# Patient Record
Sex: Female | Born: 1965 | Race: White | Hispanic: No | Marital: Married | Smoking: Former smoker
Health system: Southern US, Community
[De-identification: ages and names within clinical notes are randomized; demographics above are authoritative.]

## PROBLEM LIST (undated history)

## (undated) DIAGNOSIS — E079 Disorder of thyroid, unspecified: Secondary | ICD-10-CM

## (undated) DIAGNOSIS — Q638 Other specified congenital malformations of kidney: Secondary | ICD-10-CM

## (undated) DIAGNOSIS — N281 Cyst of kidney, acquired: Secondary | ICD-10-CM

## (undated) DIAGNOSIS — G932 Benign intracranial hypertension: Secondary | ICD-10-CM

## (undated) DIAGNOSIS — E039 Hypothyroidism, unspecified: Secondary | ICD-10-CM

## (undated) DIAGNOSIS — F32A Depression, unspecified: Secondary | ICD-10-CM

## (undated) DIAGNOSIS — F419 Anxiety disorder, unspecified: Secondary | ICD-10-CM

## (undated) DIAGNOSIS — E559 Vitamin D deficiency, unspecified: Secondary | ICD-10-CM

## (undated) DIAGNOSIS — F329 Major depressive disorder, single episode, unspecified: Secondary | ICD-10-CM

## (undated) DIAGNOSIS — E7800 Pure hypercholesterolemia, unspecified: Secondary | ICD-10-CM

## (undated) DIAGNOSIS — G4733 Obstructive sleep apnea (adult) (pediatric): Secondary | ICD-10-CM

## (undated) DIAGNOSIS — R000 Tachycardia, unspecified: Secondary | ICD-10-CM

## (undated) DIAGNOSIS — E785 Hyperlipidemia, unspecified: Secondary | ICD-10-CM

## (undated) DIAGNOSIS — G4730 Sleep apnea, unspecified: Secondary | ICD-10-CM

## (undated) DIAGNOSIS — K219 Gastro-esophageal reflux disease without esophagitis: Secondary | ICD-10-CM

## (undated) DIAGNOSIS — E1169 Type 2 diabetes mellitus with other specified complication: Secondary | ICD-10-CM

## (undated) HISTORY — DX: Obstructive sleep apnea (adult) (pediatric): G47.33

## (undated) HISTORY — PX: REPLACEMENT TOTAL KNEE: SUR1224

## (undated) HISTORY — PX: CERVICAL CONE BIOPSY: SUR198

## (undated) HISTORY — DX: Hypothyroidism, unspecified: E03.9

## (undated) HISTORY — DX: Benign intracranial hypertension: G93.2

## (undated) HISTORY — PX: CHOLECYSTECTOMY: SHX55

## (undated) HISTORY — DX: Pure hypercholesterolemia, unspecified: E78.00

## (undated) HISTORY — DX: Gastro-esophageal reflux disease without esophagitis: K21.9

## (undated) HISTORY — DX: Vitamin D deficiency, unspecified: E55.9

## (undated) HISTORY — DX: Other specified congenital malformations of kidney: Q63.8

## (undated) HISTORY — DX: Type 2 diabetes mellitus with other specified complication: E11.69

## (undated) HISTORY — DX: Depression, unspecified: F32.A

## (undated) HISTORY — DX: Morbid (severe) obesity due to excess calories: E66.01

## (undated) HISTORY — PX: CARPAL TUNNEL RELEASE: SHX101

## (undated) HISTORY — DX: Sleep apnea, unspecified: G47.30

## (undated) HISTORY — DX: Disorder of thyroid, unspecified: E07.9

## (undated) HISTORY — DX: Anxiety disorder, unspecified: F41.9

## (undated) HISTORY — DX: Major depressive disorder, single episode, unspecified: F32.9

## (undated) HISTORY — DX: Cyst of kidney, acquired: N28.1

## (undated) HISTORY — DX: Tachycardia, unspecified: R00.0

## (undated) HISTORY — DX: Hyperlipidemia, unspecified: E78.5

---

## 2015-11-07 DIAGNOSIS — M7661 Achilles tendinitis, right leg: Secondary | ICD-10-CM | POA: Insufficient documentation

## 2015-11-07 DIAGNOSIS — M6701 Short Achilles tendon (acquired), right ankle: Secondary | ICD-10-CM

## 2015-11-07 HISTORY — DX: Short Achilles tendon (acquired), right ankle: M67.01

## 2015-11-07 HISTORY — DX: Achilles tendinitis, right leg: M76.61

## 2018-01-20 ENCOUNTER — Other Ambulatory Visit: Payer: Self-pay | Admitting: Nurse Practitioner

## 2018-01-20 DIAGNOSIS — G932 Benign intracranial hypertension: Secondary | ICD-10-CM

## 2018-01-26 ENCOUNTER — Other Ambulatory Visit: Payer: Self-pay

## 2018-01-27 ENCOUNTER — Ambulatory Visit
Admission: RE | Admit: 2018-01-27 | Discharge: 2018-01-27 | Disposition: A | Discharge status: Home / Self Care | Payer: 59 | Source: Ambulatory Visit | Attending: Nurse Practitioner | Admitting: Nurse Practitioner

## 2018-01-27 DIAGNOSIS — G932 Benign intracranial hypertension: Secondary | ICD-10-CM

## 2018-05-05 ENCOUNTER — Other Ambulatory Visit: Payer: Self-pay

## 2018-05-05 ENCOUNTER — Encounter: Payer: Self-pay | Admitting: Neurology

## 2018-05-05 ENCOUNTER — Ambulatory Visit: Payer: 59 | Admitting: Neurology

## 2018-05-05 VITALS — BP 119/82 | HR 92 | Ht 63.0 in | Wt 302.0 lb

## 2018-05-05 DIAGNOSIS — G932 Benign intracranial hypertension: Secondary | ICD-10-CM

## 2018-05-05 HISTORY — DX: Morbid (severe) obesity due to excess calories: E66.01

## 2018-05-05 NOTE — Progress Notes (Signed)
Reason for visit: Pseudotumor cerebri  Referring physician: Dr. Jason CoopFlaherty  Natasha Sanchez is a 52 y.o. female  History of present illness:  Natasha Sanchez is a 52 year old right-handed white female with a history of morbid obesity.  The patient has a history of pseudotumor cerebri, she was originally diagnosed in 1999 and treated by Dr. Adella HareApplegate.  The patient was on Diamox for a time, she went off the medication and has had no symptoms until about 3 or 4 months ago.  The patient claims that she has gained about 20 pounds in the last 6 months.  She started getting headaches in the last several months, she underwent MRI of the brain in June 2019, this study was unremarkable.  The patient indicates that her headaches are not daily in nature, they may occur 2-3 times a week, and are behind the eye and sometimes around the entire head with a pressure sensation.  She may have some neck involvement as well, she denies any muffled hearing.  The patient has had some blurring of vision but this mainly occurred after she started eyedrops for glaucoma.  The patient has bilateral elevated intraocular pressures, they are 20 bilaterally.  The patient went to her optometrist recently and was found to have some mild disc blurring bilaterally, no hemorrhages were noted.  The patient reports no numbness or weakness of the face, arms, legs.  She denies issues controlling the bowels or the bladder.  She did report a problem with gait instability 3 months ago, but this has since improved.  She has had daily headaches over the last week.  She is on no medications for the headache currently.  The patient reports occasional double vision.  Past Medical History:  Diagnosis Date  . Anxiety   . Depression   . High cholesterol   . Sleep apnea   . Thyroid disease     Past Surgical History:  Procedure Laterality Date  . CARPAL TUNNEL RELEASE    . CERVICAL CONE BIOPSY    . CESAREAN SECTION     x2  . CHOLECYSTECTOMY    .  REPLACEMENT TOTAL KNEE Left     Family History  Problem Relation Age of Onset  . Heart attack Mother   . Kidney failure Father   . Heart attack Father   . Diabetes Father   . Diabetes Brother   . Diabetes Maternal Grandmother     Social history:  reports that she has been smoking. She has been smoking about 1.00 pack per day. She has never used smokeless tobacco. She reports that she drinks alcohol. She reports that she does not use drugs.  Medications:  Prior to Admission medications   Medication Sig Start Date End Date Taking? Authorizing Provider  buPROPion (WELLBUTRIN XL) 300 MG 24 hr tablet Take 1 tablet by mouth daily. 04/25/18  Yes [provider]  levothyroxine (SYNTHROID, LEVOTHROID) 150 MCG tablet Take 150 mcg by mouth daily. 04/23/18  Yes [provider]  metFORMIN (GLUCOPHAGE-XR) 500 MG 24 hr tablet Take 500 mg by mouth at bedtime.  02/07/18  Yes [provider]  pantoprazole (PROTONIX) 20 MG tablet Take 1 tablet by mouth as needed.  03/17/18  Yes [provider]  rosuvastatin (CRESTOR) 10 MG tablet Take 10 mg by mouth at bedtime. 02/07/18  Yes [provider]  TRAVATAN Z 0.004 % SOLN ophthalmic solution 1 drop every evening. 03/18/18  Yes [provider]  TRUEPLUS PEN NEEDLES 31G X 6 MM  MISC USE with Victoza DAILY 01/26/18  Yes [provider]  VICTOZA 18 MG/3ML SOPN inject 1.2 mg subcutaneously daily 03/25/18  Yes [provider]      Allergies  Allergen Reactions  . Tetanus Toxoids     Swelling, fever. One with horse serum per pt    ROS:  Out of a complete 14 system review of symptoms, the patient complains only of the following symptoms, and all other reviewed systems are negative.  Weight gain Blurred vision, loss of vision Headache Depression, anxiety  Blood pressure 119/82, pulse 92, height 5\' 3"  (1.6 m), weight (!) 302 lb (137 kg), SpO2 98 %.  Physical Exam  General: The patient is alert  and cooperative at the time of the examination.  The patient is morbidly obese.  Eyes: Pupils are equal, round, and reactive to light. Disc margins are blurred bilaterally.  Neck: The neck is supple, no carotid bruits are noted.  Respiratory: The respiratory examination is clear.  Cardiovascular: The cardiovascular examination reveals a regular rate and rhythm, no obvious murmurs or rubs are noted.  Skin: Extremities are without significant edema.  Neurologic Exam  Mental status: The patient is alert and oriented x 3 at the time of the examination. The patient has apparent normal recent and remote memory, with an apparently normal attention span and concentration ability.  Cranial nerves: Facial symmetry is present. There is good sensation of the face to pinprick and soft touch bilaterally. The strength of the facial muscles and the muscles to head turning and shoulder shrug are normal bilaterally. Speech is well enunciated, no aphasia or dysarthria is noted. Extraocular movements are full. Visual fields are full. The tongue is midline, and the patient has symmetric elevation of the soft palate. No obvious hearing deficits are noted.  Motor: The motor testing reveals 5 over 5 strength of all 4 extremities. Good symmetric motor tone is noted throughout.  Sensory: Sensory testing is intact to pinprick, soft touch, vibration sensation, and position sense on all 4 extremities. No evidence of extinction is noted.  Coordination: Cerebellar testing reveals good finger-nose-finger and heel-to-shin bilaterally.  Gait and station: Gait is normal. Tandem gait is normal. Romberg is negative. No drift is seen.  Reflexes: Deep tendon reflexes are symmetric and normal bilaterally. Toes are downgoing bilaterally.   MRI brain 01/27/18:  IMPRESSION: Normal noncontrast MRI appearance of the brain when allowing for artifact due to dental braces.  * MRI scan images were reviewed online. I agree with the  written report.    Assessment/Plan:  1.  Possible pseudotumor cerebri  The patient was sent for lumbar puncture, if the opening pressure is elevated, we will initiate treatment with Diamox.  The patient will follow-up in 4 months.  I have urged her to pursue an aggressive dietary program and exercise in order to lose weight.  Marlan Palau MD 05/05/2018 10:54 AM  [facility] [address] [place], [place] [address]  Phone [phone] Fax [phone]

## 2018-05-05 NOTE — Patient Instructions (Signed)
We will get a spinal tap, we will start medication if the spinal pressure is high.

## 2018-05-25 ENCOUNTER — Ambulatory Visit
Admission: RE | Admit: 2018-05-25 | Discharge: 2018-05-25 | Disposition: A | Discharge status: Home / Self Care | Payer: 59 | Source: Ambulatory Visit | Attending: Neurology | Admitting: Neurology

## 2018-05-25 ENCOUNTER — Telehealth: Payer: Self-pay | Admitting: Neurology

## 2018-05-25 VITALS — BP 117/65 | HR 72

## 2018-05-25 DIAGNOSIS — G932 Benign intracranial hypertension: Secondary | ICD-10-CM

## 2018-05-25 LAB — CSF CELL COUNT WITH DIFFERENTIAL
RBC COUNT CSF: 0 {cells}/uL (ref 0–10)
WBC CSF: 1 {cells}/uL (ref 0–5)

## 2018-05-25 LAB — PROTEIN, CSF: Total Protein, CSF: 50 mg/dL — ABNORMAL HIGH (ref 15–45)

## 2018-05-25 LAB — GLUCOSE, CSF: Glucose, CSF: 75 mg/dL (ref 40–80)

## 2018-05-25 MED ORDER — ACETAZOLAMIDE 125 MG PO TABS: 125.0000 mg | ORAL_TABLET | Freq: Two times a day (BID) | ORAL | 3 refills | Status: DC

## 2018-05-25 NOTE — Discharge Instructions (Signed)

## 2018-05-25 NOTE — Telephone Encounter (Signed)
I called the patient.  Open pressure of the spinal tap was slightly elevated at 25.  We will start low-dose Diamox taking 125 mg twice daily.  If Natasha Sanchez continues to have headaches, Natasha Sanchez is to contact our office.

## 2018-08-30 ENCOUNTER — Encounter: Payer: Self-pay | Admitting: Adult Health

## 2018-08-30 ENCOUNTER — Ambulatory Visit: Payer: 59 | Admitting: Adult Health

## 2018-08-30 VITALS — BP 159/89 | HR 93 | Ht 63.0 in | Wt 308.8 lb

## 2018-08-30 DIAGNOSIS — G932 Benign intracranial hypertension: Secondary | ICD-10-CM | POA: Diagnosis not present

## 2018-08-30 NOTE — Progress Notes (Signed)
PATIENT: Natasha Sanchez DOB: May 14, 1966  REASON FOR VISIT: follow up HISTORY FROM: patient  HISTORY OF PRESENT ILLNESS: Today 08/30/18  Natasha Sanchez is a 53 year old female who presents today for follow up after starting Diamox. She reports that she felt better immediately following her LP when CSF pressure was normalized. She did initially take Diamox as prescribed but since Christmas she has stopped taking it. She has trouble with frequent urination so did not want to take medication during the holidays. She also endorses mild cramping of unusual places like the front of her calf. Cramps do not occur frequently and do not require treatment. She has not had any headaches until today. She states that she has a tension headache today from a busy day at work. She rarely has tension headaches. She admits that she is not drinking water daily. She presents today for evaluation.     HISTORY Natasha Sanchez is a 53 year old right-handed white female with a history of morbid obesity.  The patient has a history of pseudotumor cerebri, she was originally diagnosed in 1999 and treated by Dr. Adella Hare.  The patient was on Diamox for a time, she went off the medication and has had no symptoms until about 3 or 4 months ago.  The patient claims that she has gained about 20 pounds in the last 6 months.  She started getting headaches in the last several months, she underwent MRI of the brain in June 2019, this study was unremarkable.  The patient indicates that her headaches are not daily in nature, they may occur 2-3 times a week, and are behind the eye and sometimes around the entire head with a pressure sensation.  She may have some neck involvement as well, she denies any muffled hearing.  The patient has had some blurring of vision but this mainly occurred after she started eyedrops for glaucoma.  The patient has bilateral elevated intraocular pressures, they are 20 bilaterally.  The patient went to her optometrist  recently and was found to have some mild disc blurring bilaterally, no hemorrhages were noted.  The patient reports no numbness or weakness of the face, arms, legs.  She denies issues controlling the bowels or the bladder.  She did report a problem with gait instability 3 months ago, but this has since improved.  She has had daily headaches over the last week.  She is on no medications for the headache currently.  The patient reports occasional double vision.  REVIEW OF SYSTEMS: Out of a complete 14 system review of symptoms, the patient complains only of the following symptoms, and all other reviewed systems are negative.  See HPI  ALLERGIES: Allergies  Allergen Reactions  . Tetanus Toxoids Swelling and Other (See Comments)    Swelling, fever. One with horse serum per pt    HOME MEDICATIONS: Outpatient Medications Prior to Visit  Medication Sig Dispense Refill  . acetaZOLAMIDE (DIAMOX) 125 MG tablet Take 1 tablet (125 mg total) by mouth 2 (two) times daily. 60 tablet 3  . buPROPion (WELLBUTRIN XL) 300 MG 24 hr tablet Take 1 tablet by mouth daily.  5  . levothyroxine (SYNTHROID, LEVOTHROID) 150 MCG tablet Take 150 mcg by mouth daily.  2  . metFORMIN (GLUCOPHAGE-XR) 500 MG 24 hr tablet Take 500 mg by mouth at bedtime.   1  . pantoprazole (PROTONIX) 20 MG tablet Take 1 tablet by mouth as needed.   5  . rosuvastatin (CRESTOR) 10 MG tablet Take 10 mg  by mouth at bedtime.  1  . TRAVATAN Z 0.004 % SOLN ophthalmic solution 1 drop every evening.  6  . TRUEPLUS PEN NEEDLES 31G X 6 MM MISC USE with Victoza DAILY  99  . VICTOZA 18 MG/3ML SOPN inject 1.2 mg subcutaneously daily  5   No facility-administered medications prior to visit.     PAST MEDICAL HISTORY: Past Medical History:  Diagnosis Date  . Anxiety   . Depression   . High cholesterol   . Morbid obesity (HCC) 05/05/2018  . Sleep apnea   . Thyroid disease     PAST SURGICAL HISTORY: Past Surgical History:  Procedure Laterality  Date  . CARPAL TUNNEL RELEASE    . CERVICAL CONE BIOPSY    . CESAREAN SECTION     x2  . CHOLECYSTECTOMY    . REPLACEMENT TOTAL KNEE Left     FAMILY HISTORY: Family History  Problem Relation Age of Onset  . Heart attack Mother   . Kidney failure Father   . Heart attack Father   . Diabetes Father   . Diabetes Brother   . Diabetes Maternal Grandmother     SOCIAL HISTORY: Social History   Socioeconomic History  . Marital status: Married    Spouse name: Claudette StaplerKevin Ostlund  . Number of children: 2  . Years of education: 9114  . Highest education level: Not on file  Occupational History  . Occupation: [employer]  Social Needs  . Financial resource strain: Not on file  . Food insecurity:    Worry: Not on file    Inability: Not on file  . Transportation needs:    Medical: Not on file    Non-medical: Not on file  Tobacco Use  . Smoking status: Current Every Day Smoker    Packs/day: 1.00  . Smokeless tobacco: Never Used  Substance and Sexual Activity  . Alcohol use: Yes    Comment: socially  . Drug use: Never  . Sexual activity: Not on file  Lifestyle  . Physical activity:    Days per week: Not on file    Minutes per session: Not on file  . Stress: Not on file  Relationships  . Social connections:    Talks on phone: Not on file    Gets together: Not on file    Attends religious service: Not on file    Active member of club or organization: Not on file    Attends meetings of clubs or organizations: Not on file    Relationship status: Not on file  . Intimate partner violence:    Fear of current or ex partner: Not on file    Emotionally abused: Not on file    Physically abused: Not on file    Forced sexual activity: Not on file  Other Topics Concern  . Not on file  Social History Narrative   Lives with husband, Claudette StaplerKevin Bartow   Caffeine use: 16-24ounces per day (tea/dr pepper)   Right handed       PHYSICAL EXAM  Vitals:   08/30/18 1253  BP: (!) 159/89    Pulse: 93  Weight: (!) 308 lb 12.8 oz (140.1 kg)  Height: 5\' 3"  (1.6 m)   Body mass index is 54.7 kg/m.  Generalized: Well developed, in no acute distress   Neurological examination  Mentation: Alert oriented to time, place, history taking. Follows all commands speech and language fluent Cranial nerve II-XII: Pupils were equal round reactive to light. Extraocular movements were  full, visual field were full on confrontational test. Facial sensation and strength were normal. Uvula tongue midline. Head turning and shoulder shrug  were normal and symmetric. Motor: The motor testing reveals 5 over 5 strength of all 4 extremities. Good symmetric motor tone is noted throughout.  Sensory: Sensory testing is intact to soft touch on all 4 extremities. No evidence of extinction is noted.  Coordination: Cerebellar testing reveals good finger-nose-finger and heel-to-shin bilaterally.  Gait and station: Gait is normal. Reflexes: Deep tendon reflexes are symmetric but depressed in the upper extremities.  Normal in the lower extremities.  DIAGNOSTIC DATA (LABS, IMAGING, TESTING) - I reviewed patient records, labs, notes, testing and imaging myself where available.     ASSESSMENT AND PLAN 53 y.o. year old female  has a past medical history of Anxiety, Depression, High cholesterol, Morbid obesity (HCC) (05/05/2018), Sleep apnea, and Thyroid disease.   1.  Pseudotumor cerebri  The patient has stopped her Diamox because her recent symptoms resolved after the lumbar puncture.  However this is been the same scenario in the past but then years later the symptoms returned.  The patient has not made any lifestyle changes.  Her weight has increased since the last visit.  I recommended that she follow-up with ophthalmology to see if papilledema is present.  I tried to do a funduscopic exam but was unable to determine if papilledema was present.  If the patient is unable to see her ophthalmologist within 1 to 2  weeks she was encouraged to restart Diamox.  I explained the risk associated with untreated pseudotumor cerebri.  She voiced understanding.  If she has significant side effects on Diamox she should let us know.  Also made a referral to [facility].  She is advised that if her symptoms worsen or she develops new symptoms she should let us know.  She will follow-up in 1 year or sooner if needed.  I spent 15 minutes with the patient. 50% of this time was spent reviewing symptoms associated with pseudotumor cerebri.   Butch PennyMegan Channelle Bottger, MSN, NP-C 08/30/2018, 1:06 PM [facility] [address], [address] [place], [place] [address] [phone]   Shawnie DapperAmy Lomax NP was training with me today. She spoke to and examined the patient as well.

## 2018-08-30 NOTE — Progress Notes (Signed)
I have read the note, and I agree with the clinical assessment and plan.  Charles K Willis   

## 2018-08-30 NOTE — Patient Instructions (Addendum)
Your Plan:  I would recommend restarting Diamox low dose Referral sent to [facility] Follow-up with Ophthalmologist to evaluate for optic edema if they can see you within 1-2 weeks then you can hold off on restarting diamox until you are seen.  If your symptoms worsen or you develop new symptoms please let us know.   Thank you for coming to see [place] at [facility]. I hope we have been able to provide you high quality care today.  You may receive a patient satisfaction survey over the next few weeks. We would appreciate your feedback and comments so that we may continue to improve ourselves and the health of our patients.

## 2019-01-18 ENCOUNTER — Telehealth: Payer: Self-pay

## 2019-01-18 NOTE — Telephone Encounter (Signed)
Unable to get in contact with the patient to move her over to [facility] schedule. I left her voicemail asking her to return my call. Office number was provided.   If patient calls back please r/s her with Maralyn Sago for the same day.

## 2019-03-30 ENCOUNTER — Ambulatory Visit: Payer: 59 | Admitting: Adult Health

## 2019-04-03 ENCOUNTER — Encounter: Payer: Self-pay | Admitting: Adult Health

## 2019-11-22 IMAGING — XA DG FLUORO GUIDE LUMBAR PUNCTURE
2 series · 2 of 2 positions shown · non-contrast
Comparison: none

CLINICAL DATA: Pseudotumor cerebri.

[Series 1: ortho adipose · 1 of 1 slices shown (1 of 2)]
[im 1/1]
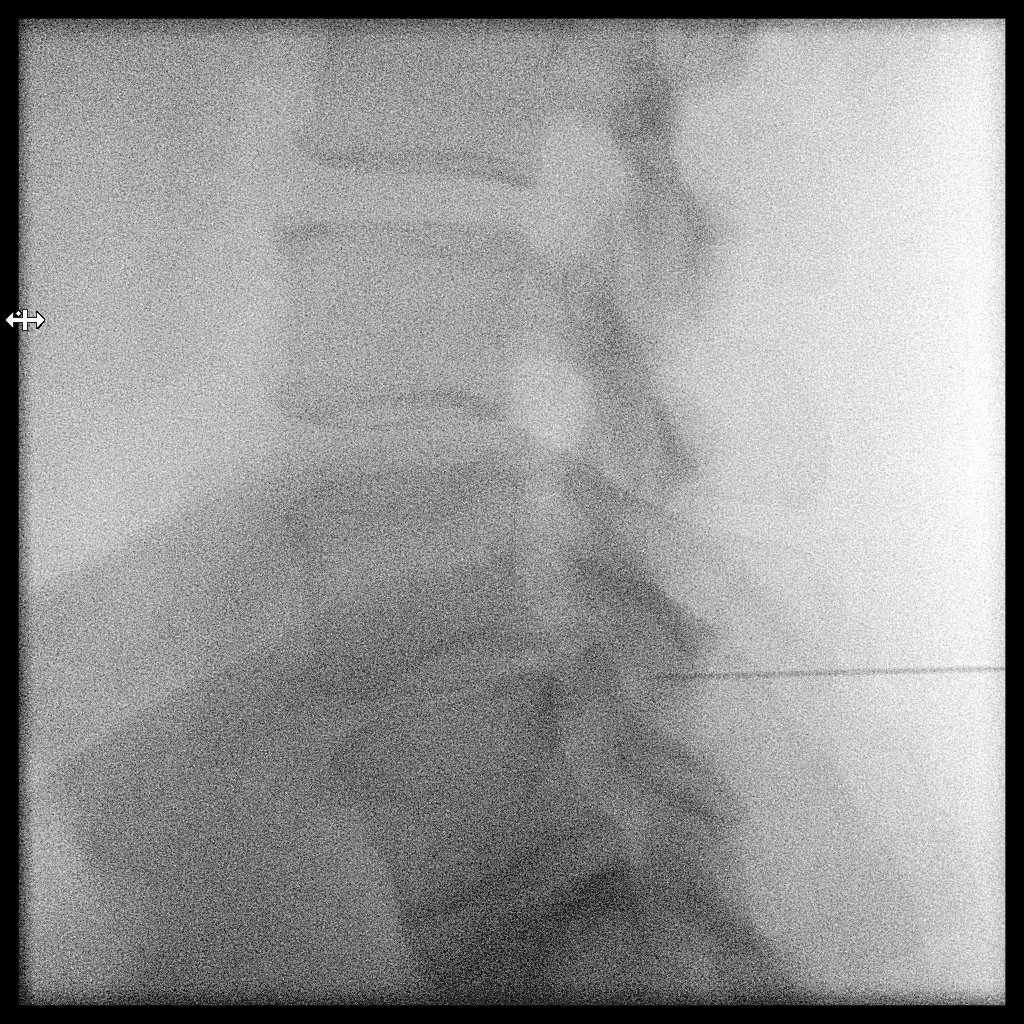

[Series 2: ortho adipose · 1 of 1 slices shown (2 of 2)]
[im 1/1]
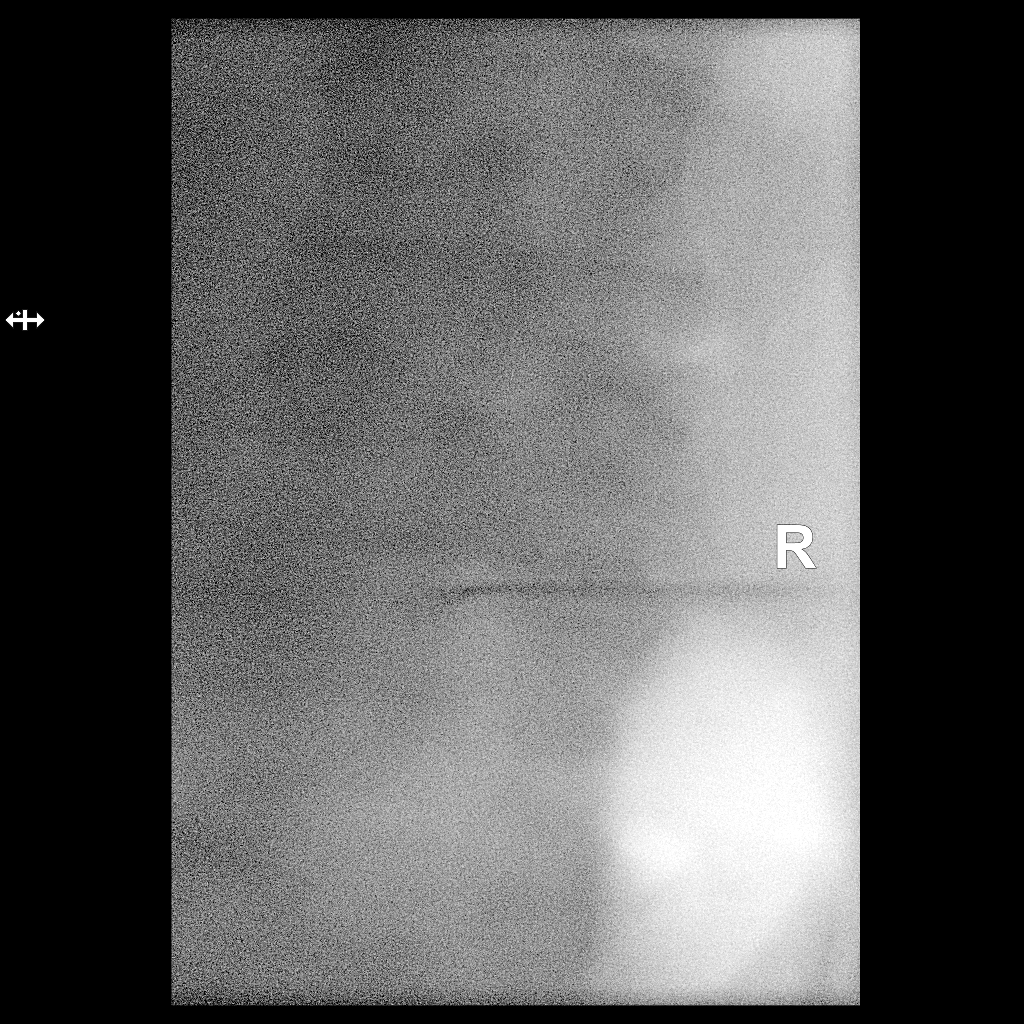

[2 of 2 positions shown; findings below may reference images not displayed]

EXAM:
DIAGNOSTIC LUMBAR PUNCTURE UNDER FLUOROSCOPIC GUIDANCE

FLUOROSCOPY TIME:  46 seconds corresponding to a Dose Area Product
of 404.55 Gy*m2

PROCEDURE:
Informed consent was obtained from the patient prior to the
procedure, including potential complications of headache, allergy,
and pain. Time-out was performed.

The procedure was challenging due to the patient's body habitus (302
pounds). A 6 inch needle was needed to access the subarachnoid
space.

With the patient lateral decubitus, the lower back was prepped with
Betadine. 1% Lidocaine was used for local anesthesia. Lumbar
puncture was performed at the L4-5 level using a 6 inch, 20 gauge
needle with return of clear CSF with an opening pressure of 25 cm
water. Closing pressure was 20 cm water. 10.0 ml of CSF were
obtained for laboratory studies. The patient tolerated the procedure
well and there were no apparent complications.

Instructions were given for bed rest for the next 24 hours.
IMPRESSION: Technically successful diagnostic lumbar puncture under fluoroscopic
guidance. Clear colorless fluid. Elevated opening pressure of 25 cm
water, consistent with the diagnosis of idiopathic intracranial
hypertension.

## 2022-06-11 ENCOUNTER — Encounter: Payer: Self-pay | Admitting: Gastroenterology

## 2022-11-19 HISTORY — PX: UMBILICAL HERNIA REPAIR: SHX196

## 2023-02-09 ENCOUNTER — Encounter: Payer: Self-pay | Admitting: Cardiology

## 2023-02-09 ENCOUNTER — Encounter: Payer: Self-pay | Admitting: *Deleted

## 2023-02-09 DIAGNOSIS — E785 Hyperlipidemia, unspecified: Secondary | ICD-10-CM | POA: Insufficient documentation

## 2023-02-09 DIAGNOSIS — G932 Benign intracranial hypertension: Secondary | ICD-10-CM | POA: Insufficient documentation

## 2023-02-09 DIAGNOSIS — E1169 Type 2 diabetes mellitus with other specified complication: Secondary | ICD-10-CM | POA: Insufficient documentation

## 2023-02-09 DIAGNOSIS — G4733 Obstructive sleep apnea (adult) (pediatric): Secondary | ICD-10-CM | POA: Insufficient documentation

## 2023-02-09 DIAGNOSIS — E039 Hypothyroidism, unspecified: Secondary | ICD-10-CM | POA: Insufficient documentation

## 2023-03-15 DIAGNOSIS — N281 Cyst of kidney, acquired: Secondary | ICD-10-CM | POA: Insufficient documentation

## 2023-03-15 DIAGNOSIS — F32A Depression, unspecified: Secondary | ICD-10-CM | POA: Insufficient documentation

## 2023-03-15 DIAGNOSIS — E559 Vitamin D deficiency, unspecified: Secondary | ICD-10-CM | POA: Insufficient documentation

## 2023-03-15 DIAGNOSIS — K219 Gastro-esophageal reflux disease without esophagitis: Secondary | ICD-10-CM | POA: Insufficient documentation

## 2023-03-15 DIAGNOSIS — E7800 Pure hypercholesterolemia, unspecified: Secondary | ICD-10-CM | POA: Insufficient documentation

## 2023-03-15 DIAGNOSIS — R000 Tachycardia, unspecified: Secondary | ICD-10-CM | POA: Insufficient documentation

## 2023-03-15 DIAGNOSIS — F419 Anxiety disorder, unspecified: Secondary | ICD-10-CM | POA: Insufficient documentation

## 2023-03-15 DIAGNOSIS — Q638 Other specified congenital malformations of kidney: Secondary | ICD-10-CM | POA: Insufficient documentation

## 2023-03-17 ENCOUNTER — Ambulatory Visit: Payer: Commercial Managed Care - PPO | Attending: Cardiology | Admitting: Cardiology

## 2023-03-17 ENCOUNTER — Encounter: Payer: Self-pay | Admitting: Cardiology

## 2023-03-17 VITALS — BP 128/78 | HR 90 | Ht 63.0 in | Wt 285.2 lb

## 2023-03-17 DIAGNOSIS — R000 Tachycardia, unspecified: Secondary | ICD-10-CM | POA: Diagnosis not present

## 2023-03-17 DIAGNOSIS — G4733 Obstructive sleep apnea (adult) (pediatric): Secondary | ICD-10-CM | POA: Diagnosis not present

## 2023-03-17 DIAGNOSIS — E1169 Type 2 diabetes mellitus with other specified complication: Secondary | ICD-10-CM | POA: Diagnosis not present

## 2023-03-17 DIAGNOSIS — Z7985 Long-term (current) use of injectable non-insulin antidiabetic drugs: Secondary | ICD-10-CM

## 2023-03-17 NOTE — Progress Notes (Signed)
Cardiology Office Note:    Date:  03/17/2023   ID:  Natasha Sanchez, DOB [birth year], MRN [id number]  PCP:  Hal Morales, NP  Cardiologist:  Norman Herrlich, MD   Referring MD: Jerrye Bushy, FNP  ASSESSMENT:    1. Tachycardia   2. Type 2 diabetes mellitus with other specified complication, without long-term current use of insulin (HCC)   3. Morbid obesity (HCC)   4. OSA (obstructive sleep apnea)    PLAN:    In order of problems listed above:  She is having symptomatic sinus tachycardia likely influenced by over-the-counter proarrhythmic's antihistamine Given a list of medications to avoid and what she can use over-the-counter Can continue the beta-blocker which has been helpful for her symptoms Continue her CPAP Continue to monitor and we will see her back in the office in 6 weeks to again review her heart rate distributions monitor strips and symptomatic response  Next appointment 6 weeks   Medication Adjustments/Labs and Tests Ordered: Current medicines are reviewed at length with the patient today.  Concerns regarding medicines are outlined above.  Orders Placed This Encounter  Procedures   EKG 12-Lead   ECHOCARDIOGRAM COMPLETE   No orders of the defined types were placed in this encounter.    No chief complaint on file.   History of Present Illness:    Natasha Sanchez is a 57 y.o. female who is being seen today for the evaluation of Palpitation with rapid heartbeat  at the request of Jerrye Bushy, FNP.  I know her well cared for her father mother and sister. She is concerned because she feels her heart is rapid and makes her fatigued She takes over-the-counter sedating antihistamine which is proarrhythmic I reviewed her smart watch and there is no episodes of atrial fibrillation everything that she is recorded is sinus tachycardia She was placed on a beta-blocker which has been helpful She has addressed her comorbidities she is 100% compliant with CPAP She is not  having chest pain shortness of breath or syncope  She was seen with her primary care physician 02/02/2023 with complaints of palpitation with rapid heart rhythm.  Her history is noteworthy for tachycardia morbid obesity type 2 diabetes and hypertension.  Her blood pressure was recorded at 124/78.  Because of the complaint of palpitation she had an event monitor ordered and was placed on beta-blocker and referred to cardiology evaluation.  Her EKG that I independently reviewed from 12/30/2022 showed sinus rhythm normal no preexcitation bundle branch block AV nodal block noted.  Laboratory studies performed include a CMP with a creatinine 0.79 GFR 87 cc potassium 4.5 sodium 142 Lipid profile cholesterol 184 LDL 114 triglycerides 140 HDL 45 TSH normal 0.161  She had an event monitor performed by her primary care physician.  Both ventricular and supraventricular ectopy were rare there were no episodes of triplets or ventricular tachycardia there were 3 brief episodes of atrial tachycardia the longest 5 complexes rate of 112 bpm no episodes of atrial fibrillation or flutter.  Her average heart rate was relatively high at 90 bpm and sinus tachycardia encompassed 21% of the recording time.  EKG Interpretation Date/Time:  Wednesday March 17 2023 15:42:57 EDT Ventricular Rate:  90 PR Interval:  150 QRS Duration:  80 QT Interval:  354 QTC Calculation: 433 R Axis:   58  Text Interpretation: Normal sinus rhythm Normal ECG No previous ECGs available Confirmed by Norman Herrlich ([id number]) on 03/17/2023 3:48:24 PM    Past Medical History:  Diagnosis Date   Anxiety    Cyst of left kidney    Depression    Duplex kidney    Left, seen on CT abdomen/pelvis 10/30/2022   GERD (gastroesophageal reflux disease)    High cholesterol    Hyperlipidemia    Hypothyroidism    Intracranial hypertension    Morbid obesity (HCC) 05/05/2018   OSA (obstructive sleep apnea)    Tachycardia    Tendonitis, Achilles, right  11/07/2015   Tightness of right heel cord 11/07/2015   Type 2 diabetes mellitus with other specified complication (HCC)    Vitamin D deficiency     Past Surgical History:  Procedure Laterality Date   CARPAL TUNNEL RELEASE     CERVICAL CONE BIOPSY     CESAREAN SECTION     x2   CHOLECYSTECTOMY     REPLACEMENT TOTAL KNEE Left    UMBILICAL HERNIA REPAIR  11/19/2022    Current Medications: Current Meds  Medication Sig   albuterol (VENTOLIN HFA) 108 (90 Base) MCG/ACT inhaler Inhale 1-2 puffs into the lungs every 4 (four) hours as needed for wheezing or shortness of breath.   bimatoprost (LUMIGAN) 0.01 % SOLN Place 1 drop into both eyes at bedtime.   fexofenadine (ALLEGRA) 180 MG tablet Take 180 mg by mouth daily.   levothyroxine (SYNTHROID) 112 MCG tablet Take 112 mcg by mouth daily before breakfast.   lisinopril (ZESTRIL) 5 MG tablet Take 5 mg by mouth daily.   metoprolol succinate (TOPROL-XL) 50 MG 24 hr tablet Take 50 mg by mouth daily.   pantoprazole sodium (PROTONIX) 40 mg Take 40 mg by mouth as needed for heartburn or indigestion.   rOPINIRole (REQUIP) 0.5 MG tablet Take 0.5 mg by mouth at bedtime.   rosuvastatin (CRESTOR) 10 MG tablet Take 10 mg by mouth every other day.   Semaglutide, 2 MG/DOSE, (OZEMPIC, 2 MG/DOSE,) 8 MG/3ML SOPN Inject 3 mLs into the skin once a week.     Allergies:   Tetanus toxoids   Social History   Socioeconomic History   Marital status: Married    Spouse name: Luree Palla   Number of children: 2   Years of education: 14   Highest education level: Not on file  Occupational History   Occupation: [employer]  Tobacco Use   Smoking status: Former    Current packs/day: 1.00    Types: Cigarettes   Smokeless tobacco: Never  Vaping Use   Vaping status: Former  Substance and Sexual Activity   Alcohol use: Never   Drug use: Never   Sexual activity: Not on file  Other Topics Concern   Not on file  Social History Narrative   Lives with  husband, Weslee Prestage   Caffeine use: 16-24ounces per day (tea/dr pepper)   Right handed    Social Determinants of Health   Financial Resource Strain: Not on file  Food Insecurity: Not on file  Transportation Needs: Not on file  Physical Activity: Not on file  Stress: Not on file  Social Connections: Not on file     Family History: The patient's family history includes Diabetes in her brother, father, and maternal grandmother; Heart Problems in her brother; Heart attack in her father and mother; Kidney failure in her father.  ROS:   ROS Please see the history of present illness.     All other systems reviewed and are negative.  EKGs/Labs/Other Studies Reviewed:    The following studies were reviewed today:   Physical Exam:  VS:  BP 128/78   Pulse 90   Ht 5\' 3"  (1.6 m)   Wt 285 lb 3.2 oz (129.4 kg)   SpO2 98%   BMI 50.52 kg/m     Wt Readings from Last 3 Encounters:  03/17/23 285 lb 3.2 oz (129.4 kg)  02/02/23 282 lb (127.9 kg)  08/30/18 (!) 308 lb 12.8 oz (140.1 kg)     GEN:  Well nourished, well developed in no acute distress HEENT: Normal NECK: No JVD; No carotid bruits LYMPHATICS: No lymphadenopathy CARDIAC: RRR, no murmurs, rubs, gallops RESPIRATORY:  Clear to auscultation without rales, wheezing or rhonchi  ABDOMEN: Soft, non-tender, non-distended MUSCULOSKELETAL:  No edema; No deformity  SKIN: Warm and dry NEUROLOGIC:  Alert and oriented x 3 PSYCHIATRIC:  Normal affect     Signed, Norman Herrlich, MD  03/17/2023 5:01 PM    [facility]

## 2023-03-17 NOTE — Patient Instructions (Addendum)
Medication Instructions:  Your physician recommends that you continue on your current medications as directed. Please refer to the Current Medication list given to you today.  *If you need a refill on your cardiac medications before your next appointment, please call your pharmacy*   Lab Work: None If you have labs (blood work) drawn today and your tests are completely normal, you will receive your results only by: MyChart Message (if you have MyChart) OR A paper copy in the mail If you have any lab test that is abnormal or we need to change your treatment, we will call you to review the results.   Testing/Procedures: Your physician has requested that you have an echocardiogram. Echocardiography is a painless test that uses sound waves to create images of your heart. It provides your doctor with information about the size and shape of your heart and how well your heart's chambers and valves are working. This procedure takes approximately one hour. There are no restrictions for this procedure. Please do NOT wear cologne, perfume, aftershave, or lotions (deodorant is allowed). Please arrive 15 minutes prior to your appointment time.    Follow-Up: At [facility], you and your health needs are our priority.  As part of our continuing mission to provide you with exceptional heart care, we have created designated Provider Care Teams.  These Care Teams include your primary Cardiologist (physician) and Advanced Practice Providers (APPs -  Physician Assistants and Nurse Practitioners) who all work together to provide you with the care you need, when you need it.  We recommend signing up for the patient portal called "MyChart".  Sign up information is provided on this After Visit Summary.  MyChart is used to connect with patients for Virtual Visits (Telemedicine).  Patients are able to view lab/test results, encounter notes, upcoming appointments, etc.  Non-urgent messages can be sent to your  provider as well.   To learn more about what you can do with MyChart, go to [email or web address].    Your next appointment:   6 week(s)  Provider:   Norman Herrlich, MD    Other Instructions None   1. Avoid all over-the-counter antihistamines except Claritin/Loratadine and Zyrtec/Cetrizine. 2. Avoid all combination including cold sinus allergies flu decongestant and sleep medications 3. You can use Robitussin DM Mucinex and Mucinex DM for cough. 4. can use Tylenol aspirin ibuprofen and naproxen but no combinations such as sleep or sinus.

## 2023-04-15 ENCOUNTER — Other Ambulatory Visit: Payer: Self-pay

## 2023-04-15 DIAGNOSIS — I739 Peripheral vascular disease, unspecified: Secondary | ICD-10-CM

## 2023-04-16 ENCOUNTER — Ambulatory Visit: Payer: Commercial Managed Care - PPO

## 2023-04-16 DIAGNOSIS — R000 Tachycardia, unspecified: Secondary | ICD-10-CM

## 2023-04-16 LAB — ECHOCARDIOGRAM COMPLETE
AR max vel: 2.06 cm2
AV Area VTI: 1.94 cm2
AV Area mean vel: 2.07 cm2
AV Mean grad: 6 mmHg
AV Peak grad: 10 mmHg
Ao pk vel: 1.58 m/s
Area-P 1/2: 3.39 cm2
S' Lateral: 3.6 cm

## 2023-04-20 NOTE — Progress Notes (Deleted)
Office Note     CC:  Left leg cramps Requesting Provider:  Hal Morales, NP  HPI: Natasha Sanchez is a 57 y.o. (10-26-1965) female presenting at the request of .Hal Morales, NP ***  The pt is *** on a statin for cholesterol management.  The pt is *** on a daily aspirin.   Other AC:  *** The pt is *** on medication for hypertension.   The pt is *** diabetic.  Tobacco hx:  ***  Past Medical History:  Diagnosis Date   Anxiety    Cyst of left kidney    Depression    Duplex kidney    Left, seen on CT abdomen/pelvis 10/30/2022   GERD (gastroesophageal reflux disease)    High cholesterol    Hyperlipidemia    Hypothyroidism    Intracranial hypertension    Morbid obesity (HCC) 05/05/2018   OSA (obstructive sleep apnea)    Tachycardia    Tendonitis, Achilles, right 11/07/2015   Tightness of right heel cord 11/07/2015   Type 2 diabetes mellitus with other specified complication (HCC)    Vitamin D deficiency     Past Surgical History:  Procedure Laterality Date   CARPAL TUNNEL RELEASE     CERVICAL CONE BIOPSY     CESAREAN SECTION     x2   CHOLECYSTECTOMY     REPLACEMENT TOTAL KNEE Left    UMBILICAL HERNIA REPAIR  11/19/2022    Social History   Socioeconomic History   Marital status: Married    Spouse name: Dola Skrzypczak   Number of children: 2   Years of education: 14   Highest education level: Not on file  Occupational History   Occupation: [employer]  Tobacco Use   Smoking status: Former    Current packs/day: 1.00    Types: Cigarettes   Smokeless tobacco: Never  Vaping Use   Vaping status: Former  Substance and Sexual Activity   Alcohol use: Never   Drug use: Never   Sexual activity: Not on file  Other Topics Concern   Not on file  Social History Narrative   Lives with husband, Delena Whitcomb   Caffeine use: 16-24ounces per day (tea/dr pepper)   Right handed    Social Determinants of Health   Financial Resource Strain: Not on file  Food  Insecurity: Not on file  Transportation Needs: Not on file  Physical Activity: Not on file  Stress: Not on file  Social Connections: Not on file  Intimate Partner Violence: Not on file   *** Family History  Problem Relation Age of Onset   Heart attack Mother    Kidney failure Father    Heart attack Father    Diabetes Father    Diabetes Brother    Heart Problems Brother    Diabetes Maternal Grandmother     Current Outpatient Medications  Medication Sig Dispense Refill   albuterol (VENTOLIN HFA) 108 (90 Base) MCG/ACT inhaler Inhale 1-2 puffs into the lungs every 4 (four) hours as needed for wheezing or shortness of breath.     bimatoprost (LUMIGAN) 0.01 % SOLN Place 1 drop into both eyes at bedtime.     fexofenadine (ALLEGRA) 180 MG tablet Take 180 mg by mouth daily.     levothyroxine (SYNTHROID) 112 MCG tablet Take 112 mcg by mouth daily before breakfast.     lisinopril (ZESTRIL) 5 MG tablet Take 5 mg by mouth daily.     MAGNESIUM-POTASSIUM PO Take 1 tablet by mouth daily. (  Patient not taking: Reported on 03/17/2023)     metoprolol succinate (TOPROL-XL) 50 MG 24 hr tablet Take 50 mg by mouth daily.     pantoprazole sodium (PROTONIX) 40 mg Take 40 mg by mouth as needed for heartburn or indigestion.  5   rOPINIRole (REQUIP) 0.5 MG tablet Take 0.5 mg by mouth at bedtime.     rosuvastatin (CRESTOR) 10 MG tablet Take 10 mg by mouth every other day.  1   Semaglutide, 2 MG/DOSE, (OZEMPIC, 2 MG/DOSE,) 8 MG/3ML SOPN Inject 3 mLs into the skin once a week.     No current facility-administered medications for this visit.    Allergies  Allergen Reactions   Tetanus Toxoids Swelling and Other (See Comments)    Swelling, fever. One with horse serum per pt     REVIEW OF SYSTEMS:  *** [X]  denotes positive finding, [ ]  denotes negative finding Cardiac  Comments:  Chest pain or chest pressure:    Shortness of breath upon exertion:    Short of breath when lying flat:    Irregular heart  rhythm:        Vascular    Pain in calf, thigh, or hip brought on by ambulation:    Pain in feet at night that wakes you up from your sleep:     Blood clot in your veins:    Leg swelling:         Pulmonary    Oxygen at home:    Productive cough:     Wheezing:         Neurologic    Sudden weakness in arms or legs:     Sudden numbness in arms or legs:     Sudden onset of difficulty speaking or slurred speech:    Temporary loss of vision in one eye:     Problems with dizziness:         Gastrointestinal    Blood in stool:     Vomited blood:         Genitourinary    Burning when urinating:     Blood in urine:        Psychiatric    Major depression:         Hematologic    Bleeding problems:    Problems with blood clotting too easily:        Skin    Rashes or ulcers:        Constitutional    Fever or chills:      PHYSICAL EXAMINATION:  There were no vitals filed for this visit.  General:  WDWN in NAD; vital signs documented above Gait: Not observed HENT: WNL, normocephalic Pulmonary: normal non-labored breathing , without wheezing Cardiac: {Desc; regular/irreg:14544} HR Abdomen: soft, NT, no masses Skin: {With/Without:20273} rashes Vascular Exam/Pulses:  Right Left  Radial {Exam; arterial pulse strength 0-4:30167} {Exam; arterial pulse strength 0-4:30167}  Ulnar {Exam; arterial pulse strength 0-4:30167} {Exam; arterial pulse strength 0-4:30167}  Femoral {Exam; arterial pulse strength 0-4:30167} {Exam; arterial pulse strength 0-4:30167}  Popliteal {Exam; arterial pulse strength 0-4:30167} {Exam; arterial pulse strength 0-4:30167}  DP {Exam; arterial pulse strength 0-4:30167} {Exam; arterial pulse strength 0-4:30167}  PT {Exam; arterial pulse strength 0-4:30167} {Exam; arterial pulse strength 0-4:30167}   Extremities: {With/Without:20273} ischemic changes, {With/Without:20273} Gangrene , {With/Without:20273} cellulitis; {With/Without:20273} open wounds;   Musculoskeletal: no muscle wasting or atrophy  Neurologic: A&O X 3;  No focal weakness or paresthesias are detected Psychiatric:  The pt has {Desc; normal/abnormal:11317::"Normal"} affect.  Non-Invasive Vascular Imaging:   ***    ASSESSMENT/PLAN: [name] is a 57 y.o. female presenting with ***   ***   Victorino Sparrow, MD [facility] [phone]

## 2023-04-23 ENCOUNTER — Encounter: Payer: 59 | Admitting: Vascular Surgery

## 2023-04-23 ENCOUNTER — Ambulatory Visit (HOSPITAL_COMMUNITY): Payer: Commercial Managed Care - PPO

## 2023-05-14 ENCOUNTER — Ambulatory Visit: Payer: Commercial Managed Care - PPO | Attending: Cardiology | Admitting: Cardiology

## 2023-05-14 ENCOUNTER — Encounter: Payer: Self-pay | Admitting: Cardiology

## 2023-05-14 VITALS — BP 139/81 | HR 90 | Ht 63.0 in | Wt 286.6 lb

## 2023-05-14 DIAGNOSIS — E1169 Type 2 diabetes mellitus with other specified complication: Secondary | ICD-10-CM | POA: Diagnosis not present

## 2023-05-14 DIAGNOSIS — R000 Tachycardia, unspecified: Secondary | ICD-10-CM

## 2023-05-14 DIAGNOSIS — E782 Mixed hyperlipidemia: Secondary | ICD-10-CM

## 2023-05-14 NOTE — Patient Instructions (Signed)
Medication Instructions:  Your physician recommends that you continue on your current medications as directed. Please refer to the Current Medication list given to you today.  *If you need a refill on your cardiac medications before your next appointment, please call your pharmacy*   Lab Work: None If you have labs (blood work) drawn today and your tests are completely normal, you will receive your results only by: MyChart Message (if you have MyChart) OR A paper copy in the mail If you have any lab test that is abnormal or we need to change your treatment, we will call you to review the results.   Testing/Procedures: None   Follow-Up: At [facility], you and your health needs are our priority.  As part of our continuing mission to provide you with exceptional heart care, we have created designated Provider Care Teams.  These Care Teams include your primary Cardiologist (physician) and Advanced Practice Providers (APPs -  Physician Assistants and Nurse Practitioners) who all work together to provide you with the care you need, when you need it.  We recommend signing up for the patient portal called "MyChart".  Sign up information is provided on this After Visit Summary.  MyChart is used to connect with patients for Virtual Visits (Telemedicine).  Patients are able to view lab/test results, encounter notes, upcoming appointments, etc.  Non-urgent messages can be sent to your provider as well.   To learn more about what you can do with MyChart, go to [email or web address].    Your next appointment:   Follow up as needed  Provider:   Norman Herrlich, MD    Other Instructions None

## 2023-05-14 NOTE — Progress Notes (Signed)
Cardiology Office Note:    Date:  05/14/2023   ID:  Natasha Sanchez, DOB March 31, 1966, MRN [id number]  PCP:  Hal Morales, NP  Cardiologist:  Norman Herrlich, MD    Referring MD: Hal Morales, NP    ASSESSMENT:    1. Tachycardia   2. Type 2 diabetes mellitus with other specified complication, without long-term current use of insulin (HCC)   3. Mixed hyperlipidemia    PLAN:    In order of problems listed above:  She is improved with the beta-blocker, I think she likely does have hypertension as mild concentric LVH and helps of blood pressure control along with her ACE inhibitor she takes for renal protection She has had very good care including ACE inhibitor and statin Continue the beta-blocker and I will plan to see her in the future as needed   Next appointment: As needed   Medication Adjustments/Labs and Tests Ordered: Current medicines are reviewed at length with the patient today.  Concerns regarding medicines are outlined above.  No orders of the defined types were placed in this encounter.  No orders of the defined types were placed in this encounter.    History of Present Illness:    Natasha Sanchez is a 57 y.o. female with a hx of palpitation with rapid heart rate last seen 03/17/2023.She had an event monitor performed by her primary care physician. Both ventricular and supraventricular ectopy were rare there were no episodes of triplets or ventricular tachycardia there were 3 brief episodes of atrial tachycardia the longest 5 complexes rate of 112 bpm no episodes of atrial fibrillation or flutter. Her average heart rate was relatively high at 90 bpm and sinus tachycardia encompassed 21% of the recording time.  An echocardiogram performed 04/16/2023 showing mild concentric LVH otherwise normal echocardiogram.  Compliance with diet, lifestyle and medications: Yes  Not only she feel better about herself but she feels better she is not having episodes of rapid heartbeat her  home blood pressure runs in the 120s to 130 and she is taking the ACE inhibitor predominantly for renal protection with type 2 diabetes but she also has mild concentric LVH She continues to self monitor with a smart watch She is not having edema shortness of breath chest pain palpitation or syncope He tolerates her statin without muscle pain or weakness Past Medical History:  Diagnosis Date   Anxiety    Cyst of left kidney    Depression    Duplex kidney    Left, seen on CT abdomen/pelvis 10/30/2022   GERD (gastroesophageal reflux disease)    High cholesterol    Hyperlipidemia    Hypothyroidism    Intracranial hypertension    Morbid obesity (HCC) 05/05/2018   OSA (obstructive sleep apnea)    Tachycardia    Tendonitis, Achilles, right 11/07/2015   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   bimatoprost (LUMIGAN) 0.01 % SOLN Place 1 drop into both eyes at bedtime.   fexofenadine (ALLEGRA) 180 MG tablet Take 180 mg by mouth daily.   levothyroxine (SYNTHROID) 112 MCG tablet Take 112 mcg by mouth daily before breakfast.   lisinopril (ZESTRIL) 5 MG tablet Take 5 mg  Cardiology Office Note:    Date:  05/14/2023   ID:  Natasha Sanchez, DOB March 31, 1966, MRN [id number]  PCP:  Hal Morales, NP  Cardiologist:  Norman Herrlich, MD    Referring MD: Hal Morales, NP    ASSESSMENT:    1. Tachycardia   2. Type 2 diabetes mellitus with other specified complication, without long-term current use of insulin (HCC)   3. Mixed hyperlipidemia    PLAN:    In order of problems listed above:  She is improved with the beta-blocker, I think she likely does have hypertension as mild concentric LVH and helps of blood pressure control along with her ACE inhibitor she takes for renal protection She has had very good care including ACE inhibitor and statin Continue the beta-blocker and I will plan to see her in the future as needed   Next appointment: As needed   Medication Adjustments/Labs and Tests Ordered: Current medicines are reviewed at length with the patient today.  Concerns regarding medicines are outlined above.  No orders of the defined types were placed in this encounter.  No orders of the defined types were placed in this encounter.    History of Present Illness:    Natasha Sanchez is a 57 y.o. female with a hx of palpitation with rapid heart rate last seen 03/17/2023.She had an event monitor performed by her primary care physician. Both ventricular and supraventricular ectopy were rare there were no episodes of triplets or ventricular tachycardia there were 3 brief episodes of atrial tachycardia the longest 5 complexes rate of 112 bpm no episodes of atrial fibrillation or flutter. Her average heart rate was relatively high at 90 bpm and sinus tachycardia encompassed 21% of the recording time.  An echocardiogram performed 04/16/2023 showing mild concentric LVH otherwise normal echocardiogram.  Compliance with diet, lifestyle and medications: Yes  Not only she feel better about herself but she feels better she is not having episodes of rapid heartbeat her  home blood pressure runs in the 120s to 130 and she is taking the ACE inhibitor predominantly for renal protection with type 2 diabetes but she also has mild concentric LVH She continues to self monitor with a smart watch She is not having edema shortness of breath chest pain palpitation or syncope He tolerates her statin without muscle pain or weakness Past Medical History:  Diagnosis Date   Anxiety    Cyst of left kidney    Depression    Duplex kidney    Left, seen on CT abdomen/pelvis 10/30/2022   GERD (gastroesophageal reflux disease)    High cholesterol    Hyperlipidemia    Hypothyroidism    Intracranial hypertension    Morbid obesity (HCC) 05/05/2018   OSA (obstructive sleep apnea)    Tachycardia    Tendonitis, Achilles, right 11/07/2015   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   bimatoprost (LUMIGAN) 0.01 % SOLN Place 1 drop into both eyes at bedtime.   fexofenadine (ALLEGRA) 180 MG tablet Take 180 mg by mouth daily.   levothyroxine (SYNTHROID) 112 MCG tablet Take 112 mcg by mouth daily before breakfast.   lisinopril (ZESTRIL) 5 MG tablet Take 5 mg  by mouth daily.   MAGNESIUM-POTASSIUM PO Take 1 tablet by mouth daily.   metoprolol succinate (TOPROL-XL) 50 MG 24 hr tablet Take 50 mg by mouth daily.   pantoprazole sodium (PROTONIX) 40 mg Take 40 mg by mouth as needed for heartburn or indigestion.   rOPINIRole (REQUIP) 0.5 MG tablet Take 0.5 mg by mouth at bedtime.   rosuvastatin (CRESTOR) 10 MG tablet Take 10 mg by mouth every other day.   Semaglutide, 2 MG/DOSE, (OZEMPIC, 2 MG/DOSE,) 8 MG/3ML SOPN Inject 3 mLs into the skin once a week.      EKGs/Labs/Other Studies  Reviewed:    The following studies were reviewed today:  Cardiac Studies & Procedures       ECHOCARDIOGRAM  ECHOCARDIOGRAM COMPLETE 04/16/2023  Narrative ECHOCARDIOGRAM REPORT    Patient Name:   ROSALEIGH REMENTER Date of Exam: 04/16/2023 Medical Rec #:  [id number]    Height:       63.0 in Accession #:    [id number]   Weight:       285.2 lb Date of Birth:  12-16-1965    BSA:          2.248 m Patient Age:    57 years     BP:           128/78 mmHg Patient Gender: F            HR:           90 bpm. Exam Location:  [facility]  Procedure: 2D Echo, Cardiac Doppler, Color Doppler and Strain Analysis  Indications:    Tachycardia [R00.0 (ICD-10-CM)]  History:        Patient has no prior history of Echocardiogram examinations. Risk Factors:Diabetes, Dyslipidemia and Former Smoker.  Sonographer:    Margreta Journey RDCS Referring Phys: [id number] Baldo Daub   Sonographer Comments: Image acquisition challenging due to patient body habitus. IMPRESSIONS   1. Left ventricular ejection fraction, by estimation, is 60 to 65%. The left ventricle has normal function. The left ventricle has no regional wall motion abnormalities. There is mild concentric left ventricular hypertrophy. Left ventricular diastolic parameters are consistent with Grade I diastolic dysfunction (impaired relaxation). The average left ventricular global longitudinal strain is -17.1 %. 2. Right ventricular systolic function is normal. The right ventricular size is normal. There is normal pulmonary artery systolic pressure. 3. The mitral valve is normal in structure. No evidence of mitral valve regurgitation. No evidence of mitral stenosis. 4. The aortic valve was not well visualized. Aortic valve regurgitation is not visualized. No aortic stenosis is present. 5. Aortic Normal DTA. 6. The inferior vena cava is normal in size with greater than 50% respiratory variability, suggesting right atrial pressure of 3 mmHg.  FINDINGS Left  Ventricle: Left ventricular ejection fraction, by estimation, is 60 to 65%. The left ventricle has normal function. The left ventricle has no regional wall motion abnormalities. The average left ventricular global longitudinal strain is -17.1 %. The left ventricular internal cavity size was normal in size. There is mild concentric left ventricular hypertrophy. Left ventricular diastolic parameters are consistent with Grade I diastolic dysfunction (impaired relaxation). Normal left ventricular filling pressure.  Right Ventricle: The right ventricular size is normal. No increase in right ventricular wall thickness. Right ventricular systolic function is normal. There is normal pulmonary artery systolic pressure. The tricuspid regurgitant velocity is 2.82 m/s, and with an assumed right atrial pressure of 3 mmHg, the estimated right ventricular systolic pressure is 34.8 mmHg.  Left Atrium:  Cardiology Office Note:    Date:  05/14/2023   ID:  Natasha Sanchez, DOB March 31, 1966, MRN [id number]  PCP:  Hal Morales, NP  Cardiologist:  Norman Herrlich, MD    Referring MD: Hal Morales, NP    ASSESSMENT:    1. Tachycardia   2. Type 2 diabetes mellitus with other specified complication, without long-term current use of insulin (HCC)   3. Mixed hyperlipidemia    PLAN:    In order of problems listed above:  She is improved with the beta-blocker, I think she likely does have hypertension as mild concentric LVH and helps of blood pressure control along with her ACE inhibitor she takes for renal protection She has had very good care including ACE inhibitor and statin Continue the beta-blocker and I will plan to see her in the future as needed   Next appointment: As needed   Medication Adjustments/Labs and Tests Ordered: Current medicines are reviewed at length with the patient today.  Concerns regarding medicines are outlined above.  No orders of the defined types were placed in this encounter.  No orders of the defined types were placed in this encounter.    History of Present Illness:    Natasha Sanchez is a 57 y.o. female with a hx of palpitation with rapid heart rate last seen 03/17/2023.She had an event monitor performed by her primary care physician. Both ventricular and supraventricular ectopy were rare there were no episodes of triplets or ventricular tachycardia there were 3 brief episodes of atrial tachycardia the longest 5 complexes rate of 112 bpm no episodes of atrial fibrillation or flutter. Her average heart rate was relatively high at 90 bpm and sinus tachycardia encompassed 21% of the recording time.  An echocardiogram performed 04/16/2023 showing mild concentric LVH otherwise normal echocardiogram.  Compliance with diet, lifestyle and medications: Yes  Not only she feel better about herself but she feels better she is not having episodes of rapid heartbeat her  home blood pressure runs in the 120s to 130 and she is taking the ACE inhibitor predominantly for renal protection with type 2 diabetes but she also has mild concentric LVH She continues to self monitor with a smart watch She is not having edema shortness of breath chest pain palpitation or syncope He tolerates her statin without muscle pain or weakness Past Medical History:  Diagnosis Date   Anxiety    Cyst of left kidney    Depression    Duplex kidney    Left, seen on CT abdomen/pelvis 10/30/2022   GERD (gastroesophageal reflux disease)    High cholesterol    Hyperlipidemia    Hypothyroidism    Intracranial hypertension    Morbid obesity (HCC) 05/05/2018   OSA (obstructive sleep apnea)    Tachycardia    Tendonitis, Achilles, right 11/07/2015   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   bimatoprost (LUMIGAN) 0.01 % SOLN Place 1 drop into both eyes at bedtime.   fexofenadine (ALLEGRA) 180 MG tablet Take 180 mg by mouth daily.   levothyroxine (SYNTHROID) 112 MCG tablet Take 112 mcg by mouth daily before breakfast.   lisinopril (ZESTRIL) 5 MG tablet Take 5 mg

## 2024-02-04 LAB — LAB REPORT - SCANNED
A1c: 6.2
EGFR: 74
Free T4: 1.46
TSH: 2.5 (ref 0.41–5.90)

## 2024-05-01 NOTE — H&P (View-Only) (Signed)
 Cardiology Office Note   Date:  05/02/2024  ID:  Natasha Sanchez, DOB 1965/12/18, MRN [id number] PCP: Lanier Rexene MATSU, NP  [facility] Providers Cardiologist:  Redell Leiter, MD Cardiology APP:  Carlin Delon BROCKS, NP     History of Present Illness [name] is a 58 y.o. female with a past medical history of coronary artery calcifications noted on CT imaging, DM2, obesity, OSA on CPAP.   04/28/2024 CT of the chest Severe vascular calcification of the coronary arteries 04/16/2023 echo EF 60-65%, mild LVH, grade I DD  She established care with Dr. Leiter in 2024 for evaluation of tachycardia, she had been started on a beta blocker which had helped her symptoms somewhat, advised she could follow up on an as needed basis.   Most recently she was evaluated in the emergency department on 04/24/2024 with complaints of left substernal chest pain that had been going on for a week, D-dimer was negative, troponin was negative, EKG revealed normal sinus rhythm, she was given prednisone for suspected URI symptoms and advised to follow-up outpatient with cardiology.  She presents today accompanied by her husband for follow-up after recent ED visit as outlined above.  She had been evaluated by her PCP ~ 2 weeks ago, apparently given an antibiotic and then prednisone while she was in the emergency department for upper respiratory symptoms.  She states that her only symptom was shortness of breath, she has not had a productive cough, fever, chills etc..  I asked her what her current symptoms are and she states she is having episodes of chest pain, initially says tightness but then says states like  an elephant is sitting on her chest.  She also has DOE.  She has a strong family history of early coronary artery disease. She denies  palpitations,  pnd, orthopnea, n, v, dizziness, syncope, edema, weight gain, or early satiety.   ROS: Review of Systems  Respiratory:  Positive for shortness of breath.    Cardiovascular:  Positive for chest pain and palpitations.     Studies Reviewed      Cardiac Studies & Procedures   ______________________________________________________________________________________________     ECHOCARDIOGRAM  ECHOCARDIOGRAM COMPLETE 04/16/2023  Narrative ECHOCARDIOGRAM REPORT    Patient Name:   Natasha Sanchez Date of Exam: 04/16/2023 Medical Rec #:  [id number]    Height:       63.0 in Accession #:    [id number]   Weight:       285.2 lb Date of Birth:  02-18-1966    BSA:          2.248 m Patient Age:    57 years     BP:           128/78 mmHg Patient Gender: F            HR:           90 bpm. Exam Location:  [place]  Procedure: 2D Echo, Cardiac Doppler, Color Doppler and Strain Analysis  Indications:    Tachycardia [R00.0 (ICD-10-CM)]  History:        Patient has no prior history of Echocardiogram examinations. Risk Factors:Diabetes, Dyslipidemia and Former Smoker.  Sonographer:    Charlie Jointer RDCS Referring Phys: [id number] REDELL JINNY LEITER   Sonographer Comments: Image acquisition challenging due to patient body habitus. IMPRESSIONS   1. Left ventricular ejection fraction, by estimation, is 60 to 65%. The left ventricle has normal function. The left ventricle has no regional wall motion abnormalities. There is  mild concentric left ventricular hypertrophy. Left ventricular diastolic parameters are consistent with Grade I diastolic dysfunction (impaired relaxation). The average left ventricular global longitudinal strain is -17.1 %. 2. Right ventricular systolic function is normal. The right ventricular size is normal. There is normal pulmonary artery systolic pressure. 3. The mitral valve is normal in structure. No evidence of mitral valve regurgitation. No evidence of mitral stenosis. 4. The aortic valve was not well visualized. Aortic valve regurgitation is not visualized. No aortic stenosis is present. 5. Aortic Normal DTA. 6. The inferior vena  cava is normal in size with greater than 50% respiratory variability, suggesting right atrial pressure of 3 mmHg.  FINDINGS Left Ventricle: Left ventricular ejection fraction, by estimation, is 60 to 65%. The left ventricle has normal function. The left ventricle has no regional wall motion abnormalities. The average left ventricular global longitudinal strain is -17.1 %. The left ventricular internal cavity size was normal in size. There is mild concentric left ventricular hypertrophy. Left ventricular diastolic parameters are consistent with Grade I diastolic dysfunction (impaired relaxation). Normal left ventricular filling pressure.  Right Ventricle: The right ventricular size is normal. No increase in right ventricular wall thickness. Right ventricular systolic function is normal. There is normal pulmonary artery systolic pressure. The tricuspid regurgitant velocity is 2.82 m/s, and with an assumed right atrial pressure of 3 mmHg, the estimated right ventricular systolic pressure is 34.8 mmHg.  Left Atrium: Left atrial size was normal in size.  Right Atrium: Right atrial size was normal in size.  Pericardium: There is no evidence of pericardial effusion.  Mitral Valve: The mitral valve is normal in structure. No evidence of mitral valve regurgitation. No evidence of mitral valve stenosis.  Tricuspid Valve: The tricuspid valve is normal in structure. Tricuspid valve regurgitation is mild . No evidence of tricuspid stenosis.  Aortic Valve: The aortic valve was not well visualized. Aortic valve regurgitation is not visualized. No aortic stenosis is present. Aortic valve mean gradient measures 6.0 mmHg. Aortic valve peak gradient measures 10.0 mmHg. Aortic valve area, by VTI measures 1.94 cm.  Pulmonic Valve: The pulmonic valve was normal in structure. Pulmonic valve regurgitation is not visualized. No evidence of pulmonic stenosis.  Aorta: The aortic arch was not well visualized, the  aortic root and ascending aorta are structurally normal, with no evidence of dilitation and Normal DTA.  Venous: The pulmonary veins were not well visualized. The inferior vena cava is normal in size with greater than 50% respiratory variability, suggesting right atrial pressure of 3 mmHg.  IAS/Shunts: No atrial level shunt detected by color flow Doppler.   LEFT VENTRICLE PLAX 2D LVIDd:         4.90 cm   Diastology LVIDs:         3.60 cm   LV e' medial:    9.36 cm/s LV PW:         1.00 cm   LV E/e' medial:  11.9 LV IVS:        1.00 cm   LV e' lateral:   8.70 cm/s LVOT diam:     2.00 cm   LV E/e' lateral: 12.8 LV SV:         62 LV SV Index:   28        2D Longitudinal Strain LVOT Area:     3.14 cm  2D Strain GLS Avg:     -17.1 %   RIGHT VENTRICLE  IVC RV Basal diam:  3.20 cm     IVC diam: 1.90 cm RV Mid diam:    2.80 cm RV S prime:     13.70 cm/s TAPSE (M-mode): 2.4 cm  LEFT ATRIUM             Index        RIGHT ATRIUM           Index LA diam:        4.70 cm 2.09 cm/m   RA Area:     10.40 cm LA Vol (A2C):   45.2 ml 20.10 ml/m  RA Volume:   17.70 ml  7.87 ml/m LA Vol (A4C):   59.2 ml 26.33 ml/m LA Biplane Vol: 52.9 ml 23.53 ml/m AORTIC VALVE AV Area (Vmax):    2.06 cm AV Area (Vmean):   2.07 cm AV Area (VTI):     1.94 cm AV Vmax:           158.40 cm/s AV Vmean:          110.280 cm/s AV VTI:            0.319 m AV Peak Grad:      10.0 mmHg AV Mean Grad:      6.0 mmHg LVOT Vmax:         104.00 cm/s LVOT Vmean:        72.550 cm/s LVOT VTI:          0.197 m LVOT/AV VTI ratio: 0.62  AORTA Ao Root diam: 3.10 cm Ao Asc diam:  3.00 cm Ao Desc diam: 2.40 cm  MITRAL VALVE                TRICUSPID VALVE MV Area (PHT): 3.39 cm     TR Peak grad:   31.8 mmHg MV Decel Time: 224 msec     TR Vmax:        282.00 cm/s MV E velocity: 111.50 cm/s MV A velocity: 120.00 cm/s  SHUNTS MV E/A ratio:  0.93         Systemic VTI:  0.20 m Systemic Diam: 2.00 cm  Redell Leiter MD Electronically signed by Redell Leiter MD Signature Date/Time: 04/16/2023/12:28:15 PM    Final          ______________________________________________________________________________________________      Risk Assessment/Calculations           Physical Exam VS:  BP 126/74   Pulse 72   Ht 5' 3 (1.6 m)   Wt 297 lb 12.8 oz (135.1 kg)   SpO2 99%   BMI 52.75 kg/m        Wt Readings from Last 3 Encounters:  05/02/24 297 lb 12.8 oz (135.1 kg)  05/14/23 286 lb 9.6 oz (130 kg)  03/17/23 285 lb 3.2 oz (129.4 kg)    GEN: Well nourished, well developed in no acute distress NECK: No JVD; No carotid bruits CARDIAC: RRR, no murmurs, rubs, gallops RESPIRATORY:  Clear to auscultation without rales, wheezing or rhonchi  ABDOMEN: Soft, non-tender, non-distended EXTREMITIES:  No edema; No deformity   ASSESSMENT AND PLAN Coronary artery calcifications with chest pain-noted on CT imaging and dictated as severe vascular calcifications of the coronary arteries as read by the radiologist. She is having episodes of chest pain that are consistent for angina as well as DOE which could be an anginal equivalent. Discussed with DOD Dr. Bernie, will proceed with cardiac catheterization. Explained risk versus benefit, all questions answered to her satisfaction.  Will start low dose Imdur  30 mg daily, will send in NTG PRN, reviewed how to use and when to present to ED. Continue ASA 81 mg daily. Check CBC, CMET. EKG without changes.   Dyslipidemia-LDL 98 10/15/2023; repeat FLP, CMET, LP(a).  Currently on Crestor  10 mg daily, we make adjustments after lab work.  Hypertension-blood pressure is well-controlled today at 126/74, continue metoprolol 50 mg daily.  DOE-likely multifactorial related to morbid obesity but could also be an anginal equivalent as outlined above.  Will refer her to [facility] pulmonology as she was scheduled to see a pulmonologist locally however would prefer a different  provider.  OSA on CPAP -compliant.  Obesity-BMI greater than 50, currently on semaglutide.  DM2 -  most recent A1C controlled at 6.1%.    Informed Consent   Shared Decision Making/Informed Consent The risks [stroke (1 in 1000), death (1 in 1000), kidney failure [usually temporary] (1 in 500), bleeding (1 in 200), allergic reaction [possibly serious] (1 in 200)], benefits (diagnostic support and management of coronary artery disease) and alternatives of a cardiac catheterization were discussed in detail with Ms. Fullam and she is willing to proceed.     Dispo: Imdur  30 mg daily, NTG prn, CBC, CMET, FLP, LP(a), left heart catheterization.   Signed, Delon JAYSON Hoover, NP

## 2024-05-01 NOTE — Progress Notes (Addendum)
 Cardiology Office Note   Date:  05/15/2024  ID:  Natasha Sanchez, DOB 04-10-1966, MRN [id number] PCP: Pandora Therisa RAMAN, NP  [facility] Providers Cardiologist:  Redell Leiter, MD Cardiology APP:  Carlin Delon BROCKS, NP     History of Present Illness Natasha Sanchez is a 58 y.o. female with a past medical history of coronary artery calcifications noted on CT imaging, DM2, obesity, OSA on CPAP.   04/28/2024 CT of the chest Severe vascular calcification of the coronary arteries 04/16/2023 echo EF 60-65%, mild LVH, grade I DD  She established care with Dr. Leiter in 2024 for evaluation of tachycardia, she had been started on a beta blocker which had helped her symptoms somewhat, advised she could follow up on an as needed basis.   Most recently she was evaluated in the emergency department on 04/24/2024 with complaints of left substernal chest pain that had been going on for a week, D-dimer was negative, troponin was negative, EKG revealed normal sinus rhythm, she was given prednisone for suspected URI symptoms and advised to follow-up outpatient with cardiology.  She presents today accompanied by her husband for follow-up after recent ED visit as outlined above.  She had been evaluated by her PCP ~ 2 weeks ago, apparently given an antibiotic and then prednisone while she was in the emergency department for upper respiratory symptoms.  She states that her only symptom was shortness of breath, she has not had a productive cough, fever, chills etc..  I asked her what her current symptoms are and she states she is having episodes of chest pain, initially says tightness but then says states like  an elephant is sitting on her chest.  She also has DOE.  She has a strong family history of early coronary artery disease. She denies  palpitations,  pnd, orthopnea, n, v, dizziness, syncope, edema, weight gain, or early satiety.   ROS: Review of Systems  Respiratory:  Positive for shortness of breath.    Cardiovascular:  Positive for chest pain and palpitations.     Studies Reviewed EKG Interpretation Date/Time:  Tuesday May 02 2024 11:10:34 EDT Ventricular Rate:  68 PR Interval:  152 QRS Duration:  76 QT Interval:  374 QTC Calculation: 397 R Axis:   52  Text Interpretation: Normal sinus rhythm Normal ECG When compared with ECG of 17-Mar-2023 15:42, No significant change was found Confirmed by Carlin Delon [phone]) on 05/02/2024 12:43:36 PM    Cardiac Studies & Procedures   ______________________________________________________________________________________________ CARDIAC CATHETERIZATION  CARDIAC CATHETERIZATION 05/09/2024  Conclusion 1.  Patent left main with no stenosis 2.  Patent LAD with mild diffuse plaquing but no significant stenosis 3.  Patent circumflex with moderate ostial stenosis of an OM branch and severe mid vessel stenosis of that same branch.  This is a small caliber vessel not suitable for PCI 4.  Patent, dominant RCA with no stenosis and mild diffuse plaquing 5.  Mildly elevated LVEDP  Recommendations: Medical therapy.  No target for PCI.  Major branches are all patent with only mild diffuse irregularity.  Findings Coronary Findings Diagnostic  Dominance: Right  Left Main Vessel is angiographically normal.  Left Anterior Descending There is mild diffuse disease throughout the vessel.  Left Circumflex There is mild diffuse disease throughout the vessel.  First Obtuse Marginal Branch 1st Mrg-1 lesion is 60% stenosed. 1st Mrg-2 lesion is 75% stenosed.  Right Coronary Artery There is mild diffuse disease throughout the vessel.  Intervention  No interventions have been documented.  ECHOCARDIOGRAM  ECHOCARDIOGRAM COMPLETE 04/16/2023  Narrative ECHOCARDIOGRAM REPORT    Patient Name:   Natasha Sanchez Date of Exam: 04/16/2023 Medical Rec #:  [id number]    Height:       63.0 in Accession #:    [id number]   Weight:       285.2 lb Date  of Birth:  Feb 06, 1966    BSA:          2.248 m Patient Age:    57 years     BP:           128/78 mmHg Patient Gender: F            HR:           90 bpm. Exam Location:  [facility]  Procedure: 2D Echo, Cardiac Doppler, Color Doppler and Strain Analysis  Indications:    Tachycardia [R00.0 (ICD-10-CM)]  History:        Patient has no prior history of Echocardiogram examinations. Risk Factors:Diabetes, Dyslipidemia and Former Smoker.  Sonographer:    Charlie Jointer RDCS Referring Phys: [id number] REDELL JINNY LEITER   Sonographer Comments: Image acquisition challenging due to patient body habitus. IMPRESSIONS   1. Left ventricular ejection fraction, by estimation, is 60 to 65%. The left ventricle has normal function. The left ventricle has no regional wall motion abnormalities. There is mild concentric left ventricular hypertrophy. Left ventricular diastolic parameters are consistent with Grade I diastolic dysfunction (impaired relaxation). The average left ventricular global longitudinal strain is -17.1 %. 2. Right ventricular systolic function is normal. The right ventricular size is normal. There is normal pulmonary artery systolic pressure. 3. The mitral valve is normal in structure. No evidence of mitral valve regurgitation. No evidence of mitral stenosis. 4. The aortic valve was not well visualized. Aortic valve regurgitation is not visualized. No aortic stenosis is present. 5. Aortic Normal DTA. 6. The inferior vena cava is normal in size with greater than 50% respiratory variability, suggesting right atrial pressure of 3 mmHg.  FINDINGS Left Ventricle: Left ventricular ejection fraction, by estimation, is 60 to 65%. The left ventricle has normal function. The left ventricle has no regional wall motion abnormalities. The average left ventricular global longitudinal strain is -17.1 %. The left ventricular internal cavity size was normal in size. There is mild concentric left ventricular  hypertrophy. Left ventricular diastolic parameters are consistent with Grade I diastolic dysfunction (impaired relaxation). Normal left ventricular filling pressure.  Right Ventricle: The right ventricular size is normal. No increase in right ventricular wall thickness. Right ventricular systolic function is normal. There is normal pulmonary artery systolic pressure. The tricuspid regurgitant velocity is 2.82 m/s, and with an assumed right atrial pressure of 3 mmHg, the estimated right ventricular systolic pressure is 34.8 mmHg.  Left Atrium: Left atrial size was normal in size.  Right Atrium: Right atrial size was normal in size.  Pericardium: There is no evidence of pericardial effusion.  Mitral Valve: The mitral valve is normal in structure. No evidence of mitral valve regurgitation. No evidence of mitral valve stenosis.  Tricuspid Valve: The tricuspid valve is normal in structure. Tricuspid valve regurgitation is mild . No evidence of tricuspid stenosis.  Aortic Valve: The aortic valve was not well visualized. Aortic valve regurgitation is not visualized. No aortic stenosis is present. Aortic valve mean gradient measures 6.0 mmHg. Aortic valve peak gradient measures 10.0 mmHg. Aortic valve area, by VTI measures 1.94 cm.  Pulmonic Valve: The pulmonic valve was normal in structure. Pulmonic valve  regurgitation is not visualized. No evidence of pulmonic stenosis.  Aorta: The aortic arch was not well visualized, the aortic root and ascending aorta are structurally normal, with no evidence of dilitation and Normal DTA.  Venous: The pulmonary veins were not well visualized. The inferior vena cava is normal in size with greater than 50% respiratory variability, suggesting right atrial pressure of 3 mmHg.  IAS/Shunts: No atrial level shunt detected by color flow Doppler.   LEFT VENTRICLE PLAX 2D LVIDd:         4.90 cm   Diastology LVIDs:         3.60 cm   LV e' medial:    9.36 cm/s LV  PW:         1.00 cm   LV E/e' medial:  11.9 LV IVS:        1.00 cm   LV e' lateral:   8.70 cm/s LVOT diam:     2.00 cm   LV E/e' lateral: 12.8 LV SV:         62 LV SV Index:   28        2D Longitudinal Strain LVOT Area:     3.14 cm  2D Strain GLS Avg:     -17.1 %   RIGHT VENTRICLE             IVC RV Basal diam:  3.20 cm     IVC diam: 1.90 cm RV Mid diam:    2.80 cm RV S prime:     13.70 cm/s TAPSE (M-mode): 2.4 cm  LEFT ATRIUM             Index        RIGHT ATRIUM           Index LA diam:        4.70 cm 2.09 cm/m   RA Area:     10.40 cm LA Vol (A2C):   45.2 ml 20.10 ml/m  RA Volume:   17.70 ml  7.87 ml/m LA Vol (A4C):   59.2 ml 26.33 ml/m LA Biplane Vol: 52.9 ml 23.53 ml/m AORTIC VALVE AV Area (Vmax):    2.06 cm AV Area (Vmean):   2.07 cm AV Area (VTI):     1.94 cm AV Vmax:           158.40 cm/s AV Vmean:          110.280 cm/s AV VTI:            0.319 m AV Peak Grad:      10.0 mmHg AV Mean Grad:      6.0 mmHg LVOT Vmax:         104.00 cm/s LVOT Vmean:        72.550 cm/s LVOT VTI:          0.197 m LVOT/AV VTI ratio: 0.62  AORTA Ao Root diam: 3.10 cm Ao Asc diam:  3.00 cm Ao Desc diam: 2.40 cm  MITRAL VALVE                TRICUSPID VALVE MV Area (PHT): 3.39 cm     TR Peak grad:   31.8 mmHg MV Decel Time: 224 msec     TR Vmax:        282.00 cm/s MV E velocity: 111.50 cm/s MV A velocity: 120.00 cm/s  SHUNTS MV E/A ratio:  0.93         Systemic VTI:  0.20 m Systemic Diam: 2.00 cm  Redell Leiter MD Electronically signed by Redell Leiter MD Signature Date/Time: 04/16/2023/12:28:15 PM    Final          ______________________________________________________________________________________________      Risk Assessment/Calculations        Physical Exam VS:  BP 126/74   Pulse 72   Ht 5' 3 (1.6 m)   Wt 297 lb 12.8 oz (135.1 kg)   SpO2 99%   BMI 52.75 kg/m        Wt Readings from Last 3 Encounters:  05/09/24 297 lb (134.7 kg)  05/02/24 297 lb  12.8 oz (135.1 kg)  05/14/23 286 lb 9.6 oz (130 kg)    GEN: Well nourished, well developed in no acute distress NECK: No JVD; No carotid bruits CARDIAC: RRR, no murmurs, rubs, gallops RESPIRATORY:  Clear to auscultation without rales, wheezing or rhonchi  ABDOMEN: Soft, non-tender, non-distended EXTREMITIES:  No edema; No deformity   ASSESSMENT AND PLAN Coronary artery calcifications with chest pain-noted on CT imaging and dictated as severe vascular calcifications of the coronary arteries as read by the radiologist. She is having episodes of chest pain that are consistent for angina as well as DOE which could be an anginal equivalent. Discussed with DOD Dr. Bernie, will proceed with cardiac catheterization. Explained risk versus benefit, all questions answered to her satisfaction. Will start low dose Imdur  30 mg daily, will send in NTG PRN, reviewed how to use and when to present to ED. Continue ASA 81 mg daily. Check CBC, CMET. EKG without changes.   Dyslipidemia-LDL 98 10/15/2023; repeat FLP, CMET, LP(a).  Currently on Crestor  10 mg daily, we make adjustments after lab work.  Hypertension-blood pressure is well-controlled today at 126/74, continue metoprolol 50 mg daily.  DOE-likely multifactorial related to morbid obesity but could also be an anginal equivalent as outlined above.  Will refer her to [facility] pulmonology as she was scheduled to see a pulmonologist locally however would prefer a different provider.  OSA on CPAP -compliant.  Obesity with BMI > than 50 - currently on semaglutide.  DM2 -  most recent A1C controlled at 6.1%.    Informed Consent   Shared Decision Making/Informed Consent The risks [stroke (1 in 1000), death (1 in 1000), kidney failure [usually temporary] (1 in 500), bleeding (1 in 200), allergic reaction [possibly serious] (1 in 200)], benefits (diagnostic support and management of coronary artery disease) and alternatives of a cardiac catheterization were  discussed in detail with Natasha Sanchez and she is willing to proceed.     Dispo: Imdur  30 mg daily, NTG prn, CBC, CMET, FLP, LP(a), left heart catheterization.   Signed, Delon JAYSON Hoover, NP

## 2024-05-02 ENCOUNTER — Encounter: Payer: Self-pay | Admitting: Cardiology

## 2024-05-02 ENCOUNTER — Ambulatory Visit: Attending: Cardiology | Admitting: Cardiology

## 2024-05-02 VITALS — BP 126/74 | HR 72 | Ht 63.0 in | Wt 297.8 lb

## 2024-05-02 DIAGNOSIS — R079 Chest pain, unspecified: Secondary | ICD-10-CM

## 2024-05-02 DIAGNOSIS — Z6841 Body Mass Index (BMI) 40.0 and over, adult: Secondary | ICD-10-CM

## 2024-05-02 DIAGNOSIS — I2510 Atherosclerotic heart disease of native coronary artery without angina pectoris: Secondary | ICD-10-CM

## 2024-05-02 DIAGNOSIS — G4733 Obstructive sleep apnea (adult) (pediatric): Secondary | ICD-10-CM

## 2024-05-02 DIAGNOSIS — E1169 Type 2 diabetes mellitus with other specified complication: Secondary | ICD-10-CM

## 2024-05-02 DIAGNOSIS — E782 Mixed hyperlipidemia: Secondary | ICD-10-CM | POA: Diagnosis not present

## 2024-05-02 DIAGNOSIS — I10 Essential (primary) hypertension: Secondary | ICD-10-CM

## 2024-05-02 DIAGNOSIS — E66813 Obesity, class 3: Secondary | ICD-10-CM

## 2024-05-02 MED ORDER — NITROGLYCERIN 0.4 MG SL SUBL: 0.4000 mg | SUBLINGUAL_TABLET | SUBLINGUAL | 6 refills | Status: AC | PRN

## 2024-05-02 MED ORDER — ISOSORBIDE MONONITRATE ER 30 MG PO TB24: 30.0000 mg | ORAL_TABLET | Freq: Every day | ORAL | 3 refills | Status: DC

## 2024-05-02 NOTE — Patient Instructions (Signed)
 Medication Instructions:  Your physician has recommended you make the following change in your medication:   Use nitroglycerin  1 tablet placed under the tongue at the first sign of chest pain or an angina attack. 1 tablet may be used every 5 minutes as needed, for up to 15 minutes. Do not take more than 3 tablets in 15 minutes. If pain persist call 911 or go to the nearest ED.  Start Imdur  30 mg daily.  *If you need a refill on your cardiac medications before your next appointment, please call your pharmacy*   Lab Work: Your physician recommends that you have a CMP, CBC, direct LDL and Lp(a) today in the office for your upcoming procedure.  If you have labs (blood work) drawn today and your tests are completely normal, you will receive your results only by: MyChart Message (if you have MyChart) OR A paper copy in the mail If you have any lab test that is abnormal or we need to change your treatment, we will call you to review the results.   Testing/Procedures:  [facility] DEPT OF [place].  [facility] [place] [address] [place] [address] Dept: [phone] Loc: [phone]  Natasha Sanchez  05/02/2024  You are scheduled for a Cardiac Catheterization on Tuesday, September 16 with Dr. Ozell Fell.  1. Please arrive at the [facility] (Main Entrance A) at [facility]: [address] [place], [place] [address] at 5:30 AM (This time is 2 hour(s) before your procedure to ensure your preparation).   Free valet parking service is available. You will check in at ADMITTING. The support person will be asked to wait in the waiting room.  It is OK to have someone drop you off and come back when you are ready to be discharged.    Special note: Every effort is made to have your procedure done on time. Please understand that emergencies sometimes delay scheduled procedures.  2. Diet: Nothing to eat after midnight.   3.  Hydration: You need to be well hydrated before your procedure. On September 16, you may drink approved liquids (see below) until 2 hours before the procedure, with 16 oz of water as your last intake.   List of approved liquids water, clear juice, clear tea, black coffee, fruit juices, non-citric and without pulp, carbonated beverages, Gatorade, Kool -Aid, plain Jello-O and plain ice popsicles.  4. Labs: You had labs today in the office.  5. Medication instructions in preparation for your procedure:   Contrast Allergy: No  Stop taking, Lisinopril (Zestril or Prinivil) Tuesday, September 16,  OzempicSunday, August 31,  On the morning of your procedure, take your Aspirin 81 mg and any morning medicines NOT listed above.  You may use sips of water.  6. Plan to go home the same day, you will only stay overnight if medically necessary. 7. Bring a current list of your medications and current insurance cards. 8. You MUST have a responsible person to drive you home. 9. Someone MUST be with you the first 24 hours after you arrive home or your discharge will be delayed. 10. Please wear clothes that are easy to get on and off and wear slip-on shoes.  Thank you for allowing us  to care for you!   -- [facility]    Follow-Up: At [facility], you and your health needs are our priority.  As part of our continuing mission to provide you with  exceptional heart care, we have created designated Provider Care Teams.  These Care Teams include your primary Cardiologist (physician) and Advanced Practice Providers (APPs -  Physician Assistants and Nurse Practitioners) who all work together to provide you with the care you need, when you need it.  We recommend signing up for the patient portal called MyChart.  Sign up information is provided on this After Visit Summary.  MyChart is used to connect with patients for Virtual Visits (Telemedicine).  Patients are able to view  lab/test results, encounter notes, upcoming appointments, etc.  Non-urgent messages can be sent to your provider as well.   To learn more about what you can do with MyChart, go to [email or web address].    Your next appointment:   4 week(s)  The format for your next appointment:   In Person  Provider:   Redell Leiter, MD   Other Instructions  Coronary Angiogram With Stent Coronary angiogram with stent placement is a procedure to widen or open a narrow blood vessel of the heart (coronary artery). Arteries may become blocked by cholesterol buildup (plaques) in the lining of the artery wall. When a coronary artery becomes partially blocked, blood flow to that area decreases. This may lead to chest pain or a heart attack (myocardial infarction). A stent is a small piece of metal that looks like mesh or spring. Stent placement may be done as treatment after a heart attack, or to prevent a heart attack if a blocked artery is found by a coronary angiogram. Let your health care provider know about: Any allergies you have, including allergies to medicines or contrast dye. All medicines you are taking, including vitamins, herbs, eye drops, creams, and over-the-counter medicines. Any problems you or family members have had with anesthetic medicines. Any blood disorders you have. Any surgeries you have had. Any medical conditions you have, including kidney problems or kidney failure. Whether you are pregnant or may be pregnant. Whether you are breastfeeding. What are the risks? Generally, this is a safe procedure. However, serious problems may occur, including: Damage to nearby structures or organs, such as the heart, blood vessels, or kidneys. A return of blockage. Bleeding, infection, or bruising at the insertion site. A collection of blood under the skin (hematoma) at the insertion site. A blood clot in another part of the body. Allergic reaction to medicines or dyes. Bleeding into the  abdomen (retroperitoneal bleeding). Stroke (rare). Heart attack (rare). What happens before the procedure? Staying hydrated Follow instructions from your health care provider about hydration, which may include: Up to 2 hours before the procedure - you may continue to drink clear liquids, such as water, clear fruit juice, black coffee, and plain tea.    Eating and drinking restrictions Follow instructions from your health care provider about eating and drinking, which may include: 8 hours before the procedure - stop eating heavy meals or foods, such as meat, fried foods, or fatty foods. 6 hours before the procedure - stop eating light meals or foods, such as toast or cereal. 2 hours before the procedure - stop drinking clear liquids. Medicines Ask your health care provider about: Changing or stopping your regular medicines. This is especially important if you are taking diabetes medicines or blood thinners. Taking medicines such as aspirin and ibuprofen. These medicines can thin your blood. Do not take these medicines unless your health care provider tells you to take them. Generally, aspirin is recommended before a thin tube, called a catheter, is passed through a blood  vessel and inserted into the heart (cardiac catheterization). Taking over-the-counter medicines, vitamins, herbs, and supplements. General instructions Do not use any products that contain nicotine or tobacco for at least 4 weeks before the procedure. These products include cigarettes, e-cigarettes, and chewing tobacco. If you need help quitting, ask your health care provider. Plan to have someone take you home from the hospital or clinic. If you will be going home right after the procedure, plan to have someone with you for 24 hours. You may have tests and imaging procedures. Ask your health care provider: How your insertion site will be marked. Ask which artery will be used for the procedure. What steps will be taken to  help prevent infection. These may include: Removing hair at the insertion site. Washing skin with a germ-killing soap. Taking antibiotic medicine. What happens during the procedure? An IV will be inserted into one of your veins. Electrodes may be placed on your chest to monitor your heart rate during the procedure. You will be given one or more of the following: A medicine to help you relax (sedative). A medicine to numb the area (local anesthetic) for catheter insertion. A small incision will be made for catheter insertion. The catheter will be inserted into an artery using a guide wire. The location may be in your groin, your wrist, or the fold of your arm (near your elbow). An X-ray procedure (fluoroscopy) will be used to help guide the catheter to the opening of the heart arteries. A dye will be injected into the catheter. X-rays will be taken. The dye helps to show where any narrowing or blockages are located in the arteries. Tell your health care provider if you have chest pain or trouble breathing. A tiny wire will be guided to the blocked spot, and a balloon will be inflated to make the artery wider. The stent will be expanded to crush the plaques into the wall of the vessel. The stent will hold the area open and improve the blood flow. Most stents have a drug coating to reduce the risk of the stent narrowing over time. The artery may be made wider using a drill, laser, or other tools that remove plaques. The catheter will be removed when the blood flow improves. The stent will stay where it was placed, and the lining of the artery will grow over it. A bandage (dressing) will be placed on the insertion site. Pressure will be applied to stop bleeding. The IV will be removed. This procedure may vary among health care providers and hospitals.    What happens after the procedure? Your blood pressure, heart rate, breathing rate, and blood oxygen level will be monitored until you leave the  hospital or clinic. If the procedure is done through the leg, you will lie flat in bed for a few hours or for as long as told by your health care provider. You will be instructed not to bend or cross your legs. The insertion site and the pulse in your foot or wrist will be checked often. You may have more blood tests, X-rays, and a test that records the electrical activity of your heart (electrocardiogram, or ECG). Do not drive for 24 hours if you were given a sedative during your procedure. Summary Coronary angiogram with stent placement is a procedure to widen or open a narrowed coronary artery. This is done to treat heart problems. Before the procedure, let your health care provider know about all the medical conditions and surgeries you have or  have had. This is a safe procedure. However, some problems may occur, including damage to nearby structures or organs, bleeding, blood clots, or allergies. Follow your health care provider's instructions about eating, drinking, medicines, and other lifestyle changes, such as quitting tobacco use before the procedure. This information is not intended to replace advice given to you by your health care provider. Make sure you discuss any questions you have with your health care provider. Document Revised: 03/01/2019 Document Reviewed: 03/01/2019 Elsevier Patient Education  2021 [employer].

## 2024-05-04 ENCOUNTER — Ambulatory Visit: Payer: Self-pay | Admitting: Cardiology

## 2024-05-04 LAB — COMPREHENSIVE METABOLIC PANEL WITH GFR
ALT: 22 IU/L (ref 0–32)
AST: 27 IU/L (ref 0–40)
Albumin: 4 g/dL (ref 3.8–4.9)
Alkaline Phosphatase: 74 IU/L (ref 44–121)
BUN/Creatinine Ratio: 17 (ref 9–23)
BUN: 15 mg/dL (ref 6–24)
Bilirubin Total: 0.3 mg/dL (ref 0.0–1.2)
CO2: 24 mmol/L (ref 20–29)
Calcium: 9.6 mg/dL (ref 8.7–10.2)
Chloride: 104 mmol/L (ref 96–106)
Creatinine, Ser: 0.89 mg/dL (ref 0.57–1.00)
Globulin, Total: 2.7 g/dL (ref 1.5–4.5)
Glucose: 67 mg/dL — ABNORMAL LOW (ref 70–99)
Potassium: 4.6 mmol/L (ref 3.5–5.2)
Sodium: 142 mmol/L (ref 134–144)
Total Protein: 6.7 g/dL (ref 6.0–8.5)
eGFR: 75 mL/min/1.73

## 2024-05-04 LAB — CBC WITH DIFFERENTIAL/PLATELET
Basophils Absolute: 0.1 x10E3/uL (ref 0.0–0.2)
Basos: 1 %
EOS (ABSOLUTE): 0.5 x10E3/uL — ABNORMAL HIGH (ref 0.0–0.4)
Eos: 4 %
Hematocrit: 39.6 % (ref 34.0–46.6)
Hemoglobin: 13.1 g/dL (ref 11.1–15.9)
Immature Grans (Abs): 0.1 x10E3/uL (ref 0.0–0.1)
Immature Granulocytes: 1 %
Lymphocytes Absolute: 3.5 x10E3/uL — ABNORMAL HIGH (ref 0.7–3.1)
Lymphs: 29 %
MCH: 31 pg (ref 26.6–33.0)
MCHC: 33.1 g/dL (ref 31.5–35.7)
MCV: 94 fL (ref 79–97)
Monocytes Absolute: 0.8 x10E3/uL (ref 0.1–0.9)
Monocytes: 6 %
Neutrophils Absolute: 7.2 x10E3/uL — ABNORMAL HIGH (ref 1.4–7.0)
Neutrophils: 59 %
Platelets: 298 x10E3/uL (ref 150–450)
RBC: 4.22 x10E6/uL (ref 3.77–5.28)
RDW: 12.7 % (ref 11.7–15.4)
WBC: 12.1 x10E3/uL — ABNORMAL HIGH (ref 3.4–10.8)

## 2024-05-04 LAB — LIPOPROTEIN A (LPA): Lipoprotein (a): <8.4 nmol/L

## 2024-05-04 LAB — LDL CHOLESTEROL, DIRECT: LDL Direct: 72 mg/dL (ref 0–99)

## 2024-05-04 MED ORDER — ROSUVASTATIN CALCIUM 20 MG PO TABS: 20.0000 mg | ORAL_TABLET | ORAL | 3 refills | Status: AC

## 2024-05-04 NOTE — Telephone Encounter (Signed)
-----   Message from Delon JAYSON Hoover sent at 05/04/2024  8:31 AM EDT ----- Labs are stable for her cath.  Her LP(a) is normal--this means there is not a genetic component to her cholesterol disorder, rather it is most likely associated with lifestyle choices.  This is good  news though.  Her bad cholesterol is 72, I want this better knowing that the plaque is sticking into the arteries in her heart, so increase her Crestor  to 20 mg--take 2 tablets daily.  Will repeat blood work when  she comes back following her cath. ----- Message ----- From: Interface, [facility] Lab Results In Sent: 05/04/2024   5:39 AM EDT To: Delon JAYSON Hoover, NP

## 2024-05-08 ENCOUNTER — Telehealth: Payer: Self-pay | Admitting: *Deleted

## 2024-05-08 NOTE — Telephone Encounter (Addendum)
 Cardiac Catheterization scheduled at [facility] for: Tuesday May 09, 2024 7:30 AM Arrival time [employer] [facility] Main Entrance A at: 5:30 AM  Diet: -Nothing to eat after midnight.  Hydration: -May drink clear liquids until 2 hours before the procedure.  Approved liquids: Water , clear tea, black coffee, fruit juices-non-citric and without pulp,Gatorade, plain Jello/popsicles.   -Please drink 16 oz of water  2 hours before procedure.  Medication instructions: -Usual morning medications can be taken including aspirin  81 mg.  Ozempic -weekly on Sundays-did not take 05/07/24  Plan to go home the same day, you will only stay overnight if medically necessary.  You must have responsible adult to drive you home.  Someone must be with you the first 24 hours after you arrive home.  Reviewed procedure instructions with patient.

## 2024-05-09 ENCOUNTER — Ambulatory Visit (HOSPITAL_COMMUNITY)
Admission: RE | Admit: 2024-05-09 | Discharge: 2024-05-09 | Disposition: A | Discharge status: Home / Self Care | Attending: Cardiovascular Disease | Admitting: Cardiovascular Disease

## 2024-05-09 ENCOUNTER — Other Ambulatory Visit: Payer: Self-pay

## 2024-05-09 ENCOUNTER — Encounter (HOSPITAL_COMMUNITY)
Admission: RE | Disposition: A | Discharge status: Home / Self Care | Payer: Self-pay | Source: Home / Self Care | Attending: Cardiovascular Disease

## 2024-05-09 DIAGNOSIS — E785 Hyperlipidemia, unspecified: Secondary | ICD-10-CM | POA: Diagnosis not present

## 2024-05-09 DIAGNOSIS — I25119 Atherosclerotic heart disease of native coronary artery with unspecified angina pectoris: Secondary | ICD-10-CM | POA: Diagnosis present

## 2024-05-09 DIAGNOSIS — E782 Mixed hyperlipidemia: Secondary | ICD-10-CM

## 2024-05-09 DIAGNOSIS — G4733 Obstructive sleep apnea (adult) (pediatric): Secondary | ICD-10-CM | POA: Diagnosis not present

## 2024-05-09 DIAGNOSIS — E119 Type 2 diabetes mellitus without complications: Secondary | ICD-10-CM | POA: Diagnosis not present

## 2024-05-09 DIAGNOSIS — Z8249 Family history of ischemic heart disease and other diseases of the circulatory system: Secondary | ICD-10-CM | POA: Insufficient documentation

## 2024-05-09 DIAGNOSIS — Z7985 Long-term (current) use of injectable non-insulin antidiabetic drugs: Secondary | ICD-10-CM | POA: Insufficient documentation

## 2024-05-09 DIAGNOSIS — E1169 Type 2 diabetes mellitus with other specified complication: Secondary | ICD-10-CM

## 2024-05-09 DIAGNOSIS — Z79899 Other long term (current) drug therapy: Secondary | ICD-10-CM | POA: Insufficient documentation

## 2024-05-09 DIAGNOSIS — Z6841 Body Mass Index (BMI) 40.0 and over, adult: Secondary | ICD-10-CM | POA: Diagnosis not present

## 2024-05-09 DIAGNOSIS — I2510 Atherosclerotic heart disease of native coronary artery without angina pectoris: Secondary | ICD-10-CM

## 2024-05-09 DIAGNOSIS — I10 Essential (primary) hypertension: Secondary | ICD-10-CM

## 2024-05-09 DIAGNOSIS — R079 Chest pain, unspecified: Secondary | ICD-10-CM

## 2024-05-09 HISTORY — PX: LEFT HEART CATH AND CORONARY ANGIOGRAPHY: CATH118249

## 2024-05-09 LAB — GLUCOSE, CAPILLARY
Glucose-Capillary: 98 mg/dL (ref 70–99)
Glucose-Capillary: 102 mg/dL — ABNORMAL HIGH (ref 70–99)

## 2024-05-09 SURGERY — LEFT HEART CATH AND CORONARY ANGIOGRAPHY
Anesthesia: LOCAL

## 2024-05-09 MED ORDER — FREE WATER
250.0000 mL | Freq: Once | Status: DC
Start: 2024-05-09 — End: 2024-05-09

## 2024-05-09 MED ORDER — HEPARIN SODIUM (PORCINE) 1000 UNIT/ML IJ SOLN
INTRAMUSCULAR | Status: AC
  Filled 2024-05-09: qty 10

## 2024-05-09 MED ORDER — FENTANYL CITRATE (PF) 100 MCG/2ML IJ SOLN
INTRAMUSCULAR | Status: AC
  Filled 2024-05-09: qty 2

## 2024-05-09 MED ORDER — FREE WATER: 500.0000 mL | Freq: Once | Status: DC

## 2024-05-09 MED ORDER — MIDAZOLAM HCL 2 MG/2ML IJ SOLN
INTRAMUSCULAR | Status: DC | PRN
  Administered 2024-05-09: 1 mg via INTRAVENOUS

## 2024-05-09 MED ORDER — SODIUM CHLORIDE 0.9% FLUSH: 3.0000 mL | Freq: Two times a day (BID) | INTRAVENOUS | Status: DC

## 2024-05-09 MED ORDER — FENTANYL CITRATE (PF) 100 MCG/2ML IJ SOLN
INTRAMUSCULAR | Status: DC | PRN
  Administered 2024-05-09: 25 ug via INTRAVENOUS

## 2024-05-09 MED ORDER — LIDOCAINE HCL (PF) 1 % IJ SOLN
INTRAMUSCULAR | Status: DC | PRN
  Administered 2024-05-09: 2 mL

## 2024-05-09 MED ORDER — LIDOCAINE HCL (PF) 1 % IJ SOLN
INTRAMUSCULAR | Status: AC
  Filled 2024-05-09: qty 30

## 2024-05-09 MED ORDER — SODIUM CHLORIDE 0.9 % IV SOLN: 250.0000 mL | INTRAVENOUS | Status: DC | PRN

## 2024-05-09 MED ORDER — LABETALOL HCL 5 MG/ML IV SOLN: 10.0000 mg | INTRAVENOUS | Status: DC | PRN

## 2024-05-09 MED ORDER — ACETAMINOPHEN 325 MG PO TABS: 650.0000 mg | ORAL_TABLET | ORAL | Status: DC | PRN

## 2024-05-09 MED ORDER — HEPARIN (PORCINE) IN NACL 1000-0.9 UT/500ML-% IV SOLN
INTRAVENOUS | Status: DC | PRN
  Administered 2024-05-09 (×2): 500 mL via INTRA_ARTERIAL

## 2024-05-09 MED ORDER — IOHEXOL 350 MG/ML SOLN
INTRAVENOUS | Status: DC | PRN
  Administered 2024-05-09: 40 mL via INTRA_ARTERIAL

## 2024-05-09 MED ORDER — ASPIRIN 81 MG PO CHEW: 81.0000 mg | CHEWABLE_TABLET | ORAL | Status: DC

## 2024-05-09 MED ORDER — HEPARIN SODIUM (PORCINE) 1000 UNIT/ML IJ SOLN
INTRAMUSCULAR | Status: DC | PRN
  Administered 2024-05-09: 6000 [IU] via INTRAVENOUS

## 2024-05-09 MED ORDER — VERAPAMIL HCL 2.5 MG/ML IV SOLN
INTRAVENOUS | Status: DC | PRN
  Administered 2024-05-09: 10 mL via INTRA_ARTERIAL

## 2024-05-09 MED ORDER — SODIUM CHLORIDE 0.9% FLUSH: 3.0000 mL | INTRAVENOUS | Status: DC | PRN

## 2024-05-09 MED ORDER — HYDRALAZINE HCL 20 MG/ML IJ SOLN: 10.0000 mg | INTRAMUSCULAR | Status: DC | PRN

## 2024-05-09 MED ORDER — VERAPAMIL HCL 2.5 MG/ML IV SOLN
INTRAVENOUS | Status: AC
  Filled 2024-05-09: qty 2

## 2024-05-09 MED ORDER — ONDANSETRON HCL 4 MG/2ML IJ SOLN: 4.0000 mg | Freq: Four times a day (QID) | INTRAMUSCULAR | Status: DC | PRN

## 2024-05-09 MED ORDER — MIDAZOLAM HCL 2 MG/2ML IJ SOLN
INTRAMUSCULAR | Status: AC
  Filled 2024-05-09: qty 2

## 2024-05-09 SURGICAL SUPPLY — 10 items
CATH 5FR JL3.5 JR4 ANG PIG MP (CATHETERS) IMPLANT
DEVICE RAD COMP TR BAND LRG (VASCULAR PRODUCTS) IMPLANT
GLIDESHEATH SLEND SS 6F .021 (SHEATH) IMPLANT
GUIDEWIRE INQWIRE 1.5J.035X260 (WIRE) IMPLANT
KIT SYRINGE INJ CVI SPIKEX1 (MISCELLANEOUS) IMPLANT
MAT PREVALON FULL STRYKER (MISCELLANEOUS) IMPLANT
PACK CARDIAC CATHETERIZATION (CUSTOM PROCEDURE TRAY) ×1 IMPLANT
SET ATX-X65L (MISCELLANEOUS) IMPLANT
SHEATH PROBE COVER 6X72 (BAG) IMPLANT
STATION PROTECTION PRESSURIZED (MISCELLANEOUS) IMPLANT

## 2024-05-09 NOTE — Interval H&P Note (Signed)
 History and Physical Interval Note:  05/09/2024 5:42 AM  Natasha Sanchez  has presented today for surgery, with the diagnosis of chest pain.  The various methods of treatment have been discussed with the patient and family. After consideration of risks, benefits and other options for treatment, the patient has consented to  Procedure(s): LEFT HEART CATH AND CORONARY ANGIOGRAPHY (N/A) as a surgical intervention.  The patient's history has been reviewed, patient examined, no change in status, stable for surgery.  I have reviewed the patient's chart and labs.  Questions were answered to the patient's satisfaction.     Ozell Fell

## 2024-05-09 NOTE — Discharge Instructions (Signed)

## 2024-05-10 ENCOUNTER — Encounter (HOSPITAL_COMMUNITY): Payer: Self-pay | Admitting: Cardiovascular Disease

## 2024-05-29 NOTE — Progress Notes (Unsigned)
 Cardiology Office Note:    Date:  05/30/2024   ID:  Natasha Sanchez, DOB [birth year], MRN [id number]  PCP:  Natasha Therisa RAMAN, NP  Cardiologist:  Natasha Leiter, MD    Referring MD: Natasha Rexene MATSU, NP    ASSESSMENT:    1. Coronary artery disease of native artery of native heart with stable angina pectoris   2. Weakness   3. Dizziness   4. Essential hypertension   5. Mixed hyperlipidemia    PLAN:    In order of problems listed above:  Initially visit follow-up from cardiac cath has evolved into something else Stable CAD did not have PCI and stent I think with her passing clots thrombosed hemorrhoids are forced to stop her aspirin  for a week till she stops bleeding Continue medical therapy for hypertension hyperlipidemia Follow-up with her PCP regarding urinary tract infection and now fungal infection and diabetic woman   Next appointment: 6 months   Medication Adjustments/Labs and Tests Ordered: Current medicines are reviewed at length with the patient today.  Concerns regarding medicines are outlined above.  Orders Placed This Encounter  Procedures   EKG 12-Lead   No orders of the defined types were placed in this encounter.    History of Present Illness:    Natasha Sanchez is a 58 y.o. female with a hx of coronary artery calcification on CT scan type 2 diabetes obesity and obstructive sleep apnea treated with CPAP last seen 05/02/2024.  She had an ED evaluation for chest pain 04/24/2024 without findings of acute coronary syndrome.  She left heart catheterization 05/09/2024 she had severe stenosis in the mid left circumflex coronary artery small caliber not suitable for PCI and no other focal stenotic lesion.  Compliance with diet, lifestyle and medications: Yes  She is not doing well she has had multiple ED visits with urinary tract infection she tells me she has a perineal fungal infection having severe pain urinary incontinence and now has developed thrombosed hemorrhoids and  after the ED visit passing clots. Obviously does not feel well she has been lightheaded she said that she has not had a large amount of blood loss but we will check a CBC today I think that we are forced to stop her aspirin  while she is having rectal bleeding she request referral to Dr. Lynwood Search regarding her hemorrhoids No angina shortness of breath syncope Past Medical History:  Diagnosis Date   Anxiety    Cyst of left kidney    Depression    Duplex kidney    Left, seen on CT abdomen/pelvis 10/30/2022   GERD (gastroesophageal reflux disease)    High cholesterol    Hyperlipidemia    Hypothyroidism    Intracranial hypertension    Morbid obesity (HCC) 05/05/2018   OSA (obstructive sleep apnea)    Tachycardia    Tendonitis, Achilles, right 11/07/2015   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   ALPRAZolam (XANAX) 0.25 MG tablet Take 0.25 mg by mouth at bedtime as needed for anxiety.   aspirin  EC 81 MG tablet Take 81 mg by mouth daily. Swallow whole.   FLUoxetine (PROZAC) 20 MG capsule Take 20 mg by mouth daily.  gabapentin (NEURONTIN) 100 MG capsule Take 100 mg by mouth at bedtime.   hydrocortisone (ANUSOL-HC) 2.5 % rectal cream Apply 1 Application topically.   isosorbide  mononitrate (IMDUR ) 30 MG 24 hr tablet Take 1 tablet (30 mg total) by mouth daily. (Patient taking differently: Take 15 mg by mouth at bedtime.)   levothyroxine (SYNTHROID) 112 MCG tablet Take 112 mcg by mouth daily before breakfast.   lisinopril (ZESTRIL) 5 MG tablet Take 5 mg by mouth daily.   MAGNESIUM-POTASSIUM PO Take 1 drop by mouth at bedtime.   metoprolol succinate (TOPROL-XL) 50 MG 24 hr tablet Take 50 mg  by mouth daily.   MOUNJARO 2.5 MG/0.5ML Pen Inject 2.5 mg into the skin once a week.   nitroGLYCERIN  (NITROSTAT ) 0.4 MG SL tablet Place 1 tablet (0.4 mg total) under the tongue every 5 (five) minutes as needed.   pantoprazole sodium (PROTONIX) 40 mg Take 40 mg by mouth daily.   phenazopyridine (PYRIDIUM) 100 MG tablet Take 100 mg by mouth 3 (three) times daily as needed.   rOPINIRole (REQUIP) 0.5 MG tablet Take 0.5 mg by mouth at bedtime.   rosuvastatin  (CRESTOR ) 20 MG tablet Take 1 tablet (20 mg total) by mouth every other day.   Semaglutide, 2 MG/DOSE, (OZEMPIC, 2 MG/DOSE,) 8 MG/3ML SOPN Inject 3 mLs into the skin once a week.   SYMBICORT 80-4.5 MCG/ACT inhaler Inhale 2 puffs into the lungs 2 (two) times daily.      EKGs/Labs/Other Studies Reviewed:    The following studies were reviewed today:  Cardiac Studies & Procedures   ______________________________________________________________________________________________ CARDIAC CATHETERIZATION  CARDIAC CATHETERIZATION 05/09/2024  Conclusion 1.  Patent left main with no stenosis 2.  Patent LAD with mild diffuse plaquing but no significant stenosis 3.  Patent circumflex with moderate ostial stenosis of an OM branch and severe mid vessel stenosis of that same branch.  This is a small caliber vessel not suitable for PCI 4.  Patent, dominant RCA with no stenosis and mild diffuse plaquing 5.  Mildly elevated LVEDP  Recommendations: Medical therapy.  No target for PCI.  Major branches are all patent with only mild diffuse irregularity.  Findings Coronary Findings Diagnostic  Dominance: Right  Left Main Vessel is angiographically normal.  Left Anterior Descending There is mild diffuse disease throughout the vessel.  Left Circumflex There is mild diffuse disease throughout the vessel.  First Obtuse Marginal Branch 1st Mrg-1 lesion is 60% stenosed. 1st Mrg-2 lesion is 75% stenosed.  Right Coronary Artery There is mild diffuse  disease throughout the vessel.  Intervention  No interventions have been documented.     ECHOCARDIOGRAM  ECHOCARDIOGRAM COMPLETE 04/16/2023  Narrative ECHOCARDIOGRAM REPORT    Patient Name:   [name] Date of Exam: 04/16/2023 Medical Rec #:  [id number]    Height:       63.0 in Accession #:    [id number]   Weight:       285.2 lb Date of Birth:  April 10, 1966    BSA:          2.248 m Patient Age:    57 years     BP:           128/78 mmHg Patient Gender: F            HR:           90 bpm. Exam Location:  [place]  Procedure: 2D Echo, Cardiac Doppler, Color Doppler and Strain Analysis  Indications:    Tachycardia [R00.0 (ICD-10-CM)]  History:  Patient has no prior history of Echocardiogram examinations. Risk Factors:Diabetes, Dyslipidemia and Former Smoker.  Sonographer:    Charlie Jointer RDCS Referring Phys: [id number] Natasha JINNY Sanchez   Sonographer Comments: Image acquisition challenging due to patient body habitus. IMPRESSIONS   1. Left ventricular ejection fraction, by estimation, is 60 to 65%. The left ventricle has normal function. The left ventricle has no regional wall motion abnormalities. There is mild concentric left ventricular hypertrophy. Left ventricular diastolic parameters are consistent with Grade I diastolic dysfunction (impaired relaxation). The average left ventricular global longitudinal strain is -17.1 %. 2. Right ventricular systolic function is normal. The right ventricular size is normal. There is normal pulmonary artery systolic pressure. 3. The mitral valve is normal in structure. No evidence of mitral valve regurgitation. No evidence of mitral stenosis. 4. The aortic valve was not well visualized. Aortic valve regurgitation is not visualized. No aortic stenosis is present. 5. Aortic Normal DTA. 6. The inferior vena cava is normal in size with greater than 50% respiratory variability, suggesting right atrial pressure of 3 mmHg.  FINDINGS Left  Ventricle: Left ventricular ejection fraction, by estimation, is 60 to 65%. The left ventricle has normal function. The left ventricle has no regional wall motion abnormalities. The average left ventricular global longitudinal strain is -17.1 %. The left ventricular internal cavity size was normal in size. There is mild concentric left ventricular hypertrophy. Left ventricular diastolic parameters are consistent with Grade I diastolic dysfunction (impaired relaxation). Normal left ventricular filling pressure.  Right Ventricle: The right ventricular size is normal. No increase in right ventricular wall thickness. Right ventricular systolic function is normal. There is normal pulmonary artery systolic pressure. The tricuspid regurgitant velocity is 2.82 m/s, and with an assumed right atrial pressure of 3 mmHg, the estimated right ventricular systolic pressure is 34.8 mmHg.  Left Atrium: Left atrial size was normal in size.  Right Atrium: Right atrial size was normal in size.  Pericardium: There is no evidence of pericardial effusion.  Mitral Valve: The mitral valve is normal in structure. No evidence of mitral valve regurgitation. No evidence of mitral valve stenosis.  Tricuspid Valve: The tricuspid valve is normal in structure. Tricuspid valve regurgitation is mild . No evidence of tricuspid stenosis.  Aortic Valve: The aortic valve was not well visualized. Aortic valve regurgitation is not visualized. No aortic stenosis is present. Aortic valve mean gradient measures 6.0 mmHg. Aortic valve peak gradient measures 10.0 mmHg. Aortic valve area, by VTI measures 1.94 cm.  Pulmonic Valve: The pulmonic valve was normal in structure. Pulmonic valve regurgitation is not visualized. No evidence of pulmonic stenosis.  Aorta: The aortic arch was not well visualized, the aortic root and ascending aorta are structurally normal, with no evidence of dilitation and Normal DTA.  Venous: The pulmonary veins  were not well visualized. The inferior vena cava is normal in size with greater than 50% respiratory variability, suggesting right atrial pressure of 3 mmHg.  IAS/Shunts: No atrial level shunt detected by color flow Doppler.   LEFT VENTRICLE PLAX 2D LVIDd:         4.90 cm   Diastology LVIDs:         3.60 cm   LV e' medial:    9.36 cm/s LV PW:         1.00 cm   LV E/e' medial:  11.9 LV IVS:        1.00 cm   LV e' lateral:   8.70 cm/s LVOT diam:  2.00 cm   LV E/e' lateral: 12.8 LV SV:         62 LV SV Index:   28        2D Longitudinal Strain LVOT Area:     3.14 cm  2D Strain GLS Avg:     -17.1 %   RIGHT VENTRICLE             IVC RV Basal diam:  3.20 cm     IVC diam: 1.90 cm RV Mid diam:    2.80 cm RV S prime:     13.70 cm/s TAPSE (M-mode): 2.4 cm  LEFT ATRIUM             Index        RIGHT ATRIUM           Index LA diam:        4.70 cm 2.09 cm/m   RA Area:     10.40 cm LA Vol (A2C):   45.2 ml 20.10 ml/m  RA Volume:   17.70 ml  7.87 ml/m LA Vol (A4C):   59.2 ml 26.33 ml/m LA Biplane Vol: 52.9 ml 23.53 ml/m AORTIC VALVE AV Area (Vmax):    2.06 cm AV Area (Vmean):   2.07 cm AV Area (VTI):     1.94 cm AV Vmax:           158.40 cm/s AV Vmean:          110.280 cm/s AV VTI:            0.319 m AV Peak Grad:      10.0 mmHg AV Mean Grad:      6.0 mmHg LVOT Vmax:         104.00 cm/s LVOT Vmean:        72.550 cm/s LVOT VTI:          0.197 m LVOT/AV VTI ratio: 0.62  AORTA Ao Root diam: 3.10 cm Ao Asc diam:  3.00 cm Ao Desc diam: 2.40 cm  MITRAL VALVE                TRICUSPID VALVE MV Area (PHT): 3.39 cm     TR Peak grad:   31.8 mmHg MV Decel Time: 224 msec     TR Vmax:        282.00 cm/s MV E velocity: 111.50 cm/s MV A velocity: 120.00 cm/s  SHUNTS MV E/A ratio:  0.93         Systemic VTI:  0.20 m Systemic Diam: 2.00 cm  Natasha Leiter MD Electronically signed by Natasha Leiter MD Signature Date/Time: 04/16/2023/12:28:15 PM    Final           ______________________________________________________________________________________________      EKG Interpretation Date/Time:  Tuesday May 30 2024 10:46:50 EDT Ventricular Rate:  75 PR Interval:  152 QRS Duration:  82 QT Interval:  378 QTC Calculation: 422 R Axis:   49  Text Interpretation: Normal sinus rhythm Normal ECG When compared with ECG of 02-May-2024 11:10, No significant change was found Confirmed by Sanchez Natasha [phone]) on 05/30/2024 10:50:39 AM   Recent Labs: 02/04/2024: TSH 2.50 05/02/2024: ALT 22; BUN 15; Creatinine, Ser 0.89; Hemoglobin 13.1; Platelets 298; Potassium 4.6; Sodium 142  Recent Lipid Panel    Component Value Date/Time   LDLDIRECT 72 05/02/2024 1137    Physical Exam:    VS:  BP 132/84   Pulse 82   Ht 5' 3 (1.6 m)   Wt 286 lb  3.2 oz (129.8 kg)   LMP  (LMP Unknown) Comment: Pt states ablasion 2011  SpO2 98%   BMI 50.70 kg/m     Wt Readings from Last 3 Encounters:  05/30/24 286 lb 3.2 oz (129.8 kg)  05/09/24 297 lb (134.7 kg)  05/02/24 297 lb 12.8 oz (135.1 kg)     GEN: BMI exceeds 50 well nourished, well developed in no acute distress she has no pallor to the skin or membranes HEENT: Normal NECK: No JVD; No carotid bruits LYMPHATICS: No lymphadenopathy CARDIAC: RRR, no murmurs, rubs, gallops RESPIRATORY:  Clear to auscultation without rales, wheezing or rhonchi  ABDOMEN: Soft, non-tender, non-distended MUSCULOSKELETAL:  No edema; No deformity  SKIN: Warm and dry NEUROLOGIC:  Alert and oriented x 3 PSYCHIATRIC:  Normal affect    Signed, Natasha Leiter, MD  05/30/2024 11:05 AM    [facility]

## 2024-05-30 ENCOUNTER — Encounter: Payer: Self-pay | Admitting: Cardiology

## 2024-05-30 ENCOUNTER — Ambulatory Visit: Attending: Cardiology | Admitting: Cardiology

## 2024-05-30 VITALS — BP 132/84 | HR 82 | Ht 63.0 in | Wt 286.2 lb

## 2024-05-30 DIAGNOSIS — E782 Mixed hyperlipidemia: Secondary | ICD-10-CM

## 2024-05-30 DIAGNOSIS — R531 Weakness: Secondary | ICD-10-CM

## 2024-05-30 DIAGNOSIS — R42 Dizziness and giddiness: Secondary | ICD-10-CM

## 2024-05-30 DIAGNOSIS — I25118 Atherosclerotic heart disease of native coronary artery with other forms of angina pectoris: Secondary | ICD-10-CM

## 2024-05-30 DIAGNOSIS — I10 Essential (primary) hypertension: Secondary | ICD-10-CM | POA: Diagnosis not present

## 2024-05-30 NOTE — Patient Instructions (Signed)
 Medication Instructions:  Your physician has recommended you make the following change in your medication:   STOP: Aspirin   *If you need a refill on your cardiac medications before your next appointment, please call your pharmacy*  Lab Work: Your physician recommends that you return for lab work in:   Labs today: CBC  If you have labs (blood work) drawn today and your tests are completely normal, you will receive your results only by: MyChart Message (if you have MyChart) OR A paper copy in the mail If you have any lab test that is abnormal or we need to change your treatment, we will call you to review the results.  Testing/Procedures: None  Follow-Up: At [facility], you and your health needs are our priority.  As part of our continuing mission to provide you with exceptional heart care, our providers are all part of one team.  This team includes your primary Cardiologist (physician) and Advanced Practice Providers or APPs (Physician Assistants and Nurse Practitioners) who all work together to provide you with the care you need, when you need it.  Your next appointment:   9 month(s)  Provider:   Redell Leiter, MD    We recommend signing up for the patient portal called MyChart.  Sign up information is provided on this After Visit Summary.  MyChart is used to connect with patients for Virtual Visits (Telemedicine).  Patients are able to view lab/test results, encounter notes, upcoming appointments, etc.  Non-urgent messages can be sent to your provider as well.   To learn more about what you can do with MyChart, go to [email or web address].   Other Instructions See PCP for urinary tract infection.

## 2024-05-30 NOTE — Addendum Note (Signed)
 Addended by: SHERRE ADE I on: 05/30/2024 11:24 AM   Modules accepted: Orders

## 2024-05-31 ENCOUNTER — Encounter: Payer: Self-pay | Admitting: Cardiology

## 2024-05-31 LAB — CBC
Hematocrit: 40.9 % (ref 34.0–46.6)
Hemoglobin: 13.3 g/dL (ref 11.1–15.9)
MCH: 30.1 pg (ref 26.6–33.0)
MCHC: 32.5 g/dL (ref 31.5–35.7)
MCV: 93 fL (ref 79–97)
Platelets: 338 x10E3/uL (ref 150–450)
RBC: 4.42 x10E6/uL (ref 3.77–5.28)
RDW: 12.7 % (ref 11.7–15.4)
WBC: 7.6 x10E3/uL (ref 3.4–10.8)

## 2024-06-05 ENCOUNTER — Encounter (HOSPITAL_BASED_OUTPATIENT_CLINIC_OR_DEPARTMENT_OTHER): Payer: Self-pay

## 2024-06-05 ENCOUNTER — Ambulatory Visit (INDEPENDENT_AMBULATORY_CARE_PROVIDER_SITE_OTHER)

## 2024-06-05 VITALS — BP 134/80 | HR 79 | Ht 63.0 in | Wt 292.0 lb

## 2024-06-05 DIAGNOSIS — F17210 Nicotine dependence, cigarettes, uncomplicated: Secondary | ICD-10-CM

## 2024-06-05 DIAGNOSIS — Z720 Tobacco use: Secondary | ICD-10-CM

## 2024-06-05 DIAGNOSIS — R0609 Other forms of dyspnea: Secondary | ICD-10-CM | POA: Diagnosis not present

## 2024-06-05 DIAGNOSIS — G4733 Obstructive sleep apnea (adult) (pediatric): Secondary | ICD-10-CM

## 2024-06-05 NOTE — Patient Instructions (Addendum)
 Complete sleep study as ordered; follow up in 10-12 weeks for results review.  Complete PFT as ordered; follow up as above.  Follow sleep hygiene and continue to wear CPAP nightly.

## 2024-06-05 NOTE — Progress Notes (Signed)
 @Patient  ID: Natasha Sanchez, female    DOB: 24-Jan-1966, 58 y.o.   MRN: [id number]  Chief Complaint  Patient presents with   Establish Care    New Sleep     Referring provider: Carlin Delon BROCKS, NP  HPI: [name] is a 58 y/o female with PMH of CAD, DM, obesity, and OSA who presents as a new patient for evaluation of sleep.  She reports that she has had some issues with dyspnea on exertion and fatigue and has undergone outpatient workup in the form of cardiac catheterization.  Those results were reviewed and indicated that while there was coronary calcification noted, it was not amenable to PCI or stenting.  She is on medical management for coronary atherosclerosis.  She has also been struggling with hemorrhoids and bleeding from those.  Aspirin  was stopped due to the bleeding but is set to resume very soon.  This is being followed by cardiology.  She had a hemorrhoidectomy on 06/01/2024.  She states that due to the hemorrhoidectomy she has been less active in the last couple of weeks which has not exacerbated her issues with shortness of breath.  Her primary care started her on Symbicort and albuterol but she has not been using these due to lack of activity and lack of shortness of breath due to that.  She reports that she was diagnosed with sleep apnea about 23 years ago and has been using the same machine nightly since then.  She reports great benefit from use of the machine.  She orders all of her supplies online.  She attempted to complete a home sleep test 2 years ago but was unable to sleep for the test.  She reports that she goes to bed around 10 PM every night and wakes up at 6:30 in the morning.  She wakes up between 4 and 10 times per night but states that this is not due to shortness of breath or any respiratory complaints.  She reports dyspnea on exertion and chest pain as well as recent workup for this.  ROS is also positive for a nonproductive cough, irregular heartbeats, and  acid reflux.  She denies fever, chills, night sweats, weight loss.  She is a former smoker having quit 10 years ago; she reports at least 30 pack years but has also resumed smoking the last 2 weeks.  TEST/EVENTS :  05/09/2024 cardiac cath :  + for coronary atherosclerosis and narrowing but not amenable to stents or PCI; medical management recommended  04/27/2024 Chest CT w/ contrast:  mild emphysematous changes; severe coronary atherosclerosis; no nodules  Allergies  Allergen Reactions   Tetanus Toxoid-Containing Vaccines Swelling and Other (See Comments)    Swelling, fever. One with horse serum per pt     There is no immunization history on file for this patient.  Past Medical History:  Diagnosis Date   Anxiety    Cyst of left kidney    Depression    Duplex kidney    Left, seen on CT abdomen/pelvis 10/30/2022   GERD (gastroesophageal reflux disease)    High cholesterol    Hyperlipidemia    Hypothyroidism    Intracranial hypertension    Morbid obesity (HCC) 05/05/2018   OSA (obstructive sleep apnea)    Tachycardia    Tendonitis, Achilles, right 11/07/2015   Tightness of right heel cord 11/07/2015   Type 2 diabetes mellitus with other specified complication (HCC)    Vitamin D deficiency     Tobacco History: Social  History   Tobacco Use  Smoking Status Former   Current packs/day: 1.00   Types: Cigarettes  Smokeless Tobacco Never   Counseling given: Not Answered   Outpatient Medications Prior to Visit  Medication Sig Dispense Refill   albuterol (VENTOLIN HFA) 108 (90 Base) MCG/ACT inhaler Inhale 1-2 puffs into the lungs every 4 (four) hours as needed for wheezing or shortness of breath.     ALPRAZolam (XANAX) 0.25 MG tablet Take 0.25 mg by mouth at bedtime as needed for anxiety.     FLUoxetine (PROZAC) 20 MG capsule Take 20 mg by mouth daily.     gabapentin (NEURONTIN) 100 MG capsule Take 100 mg by mouth at bedtime.     hydrocortisone (ANUSOL-HC) 2.5 % rectal cream  Apply 1 Application topically.     isosorbide  mononitrate (IMDUR ) 30 MG 24 hr tablet Take 1 tablet (30 mg total) by mouth daily. (Patient taking differently: Take 15 mg by mouth at bedtime.) 90 tablet 3   levothyroxine (SYNTHROID) 112 MCG tablet Take 112 mcg by mouth daily before breakfast.     lisinopril (ZESTRIL) 5 MG tablet Take 5 mg by mouth daily.     MAGNESIUM-POTASSIUM PO Take 1 drop by mouth at bedtime.     metoprolol succinate (TOPROL-XL) 50 MG 24 hr tablet Take 50 mg by mouth daily.     MOUNJARO 2.5 MG/0.5ML Pen Inject 2.5 mg into the skin once a week.     nitroGLYCERIN  (NITROSTAT ) 0.4 MG SL tablet Place 1 tablet (0.4 mg total) under the tongue every 5 (five) minutes as needed. 25 tablet 6   pantoprazole sodium (PROTONIX) 40 mg Take 40 mg by mouth daily.  5   phenazopyridine (PYRIDIUM) 100 MG tablet Take 100 mg by mouth 3 (three) times daily as needed.     rOPINIRole (REQUIP) 0.5 MG tablet Take 0.5 mg by mouth at bedtime.     rosuvastatin  (CRESTOR ) 20 MG tablet Take 1 tablet (20 mg total) by mouth every other day. 90 tablet 3   Semaglutide, 2 MG/DOSE, (OZEMPIC, 2 MG/DOSE,) 8 MG/3ML SOPN Inject 3 mLs into the skin once a week.     SYMBICORT 80-4.5 MCG/ACT inhaler Inhale 2 puffs into the lungs 2 (two) times daily.     No facility-administered medications prior to visit.     Review of Systems: positive as per HPI  Constitutional:   No  weight loss, night sweats,  Fevers, chills, fatigue, or  lassitude.  HEENT:   No headaches,  Difficulty swallowing,  Tooth/dental problems, or  Sore throat,                No sneezing, itching, ear ache, nasal congestion, post nasal drip,   CV:  No chest pain,  Orthopnea, PND, swelling in lower extremities, anasarca, dizziness, palpitations, syncope.   GI  No heartburn, indigestion, abdominal pain, nausea, vomiting, diarrhea, change in bowel habits, loss of appetite, bloody stools.   Resp: No shortness of breath with exertion or at rest.  No excess  mucus, no productive cough,  No non-productive cough,  No coughing up of blood.  No change in color of mucus.  No wheezing.  No chest wall deformity  Skin: no rash or lesions.  GU: no dysuria, change in color of urine, no urgency or frequency.  No flank pain, no hematuria   MS:  No joint pain or swelling.  No decreased range of motion.  No back pain.    Physical Exam  BP 134/80  Pulse 79   Ht 5' 3 (1.6 m)   Wt 292 lb (132.5 kg)   LMP  (LMP Unknown) Comment: Pt states ablasion 2011  SpO2 98%   BMI 51.73 kg/m   GEN: A/Ox3; pleasant , NAD, well nourished    HEENT:  [place]/AT,  EACs-clear, TMs-wnl, NOSE-clear, THROAT-clear, no lesions, no postnasal drip or exudate noted. Mallampati 4  NECK:  Supple w/ fair ROM; no JVD; normal carotid impulses w/o bruits; no thyromegaly or nodules palpated; no lymphadenopathy.    RESP  Clear  P & A; w/o, wheezes/ rales/ or rhonchi. no accessory muscle use, no dullness to percussion  CARD:  RRR, no m/r/g, no peripheral edema, pulses intact, no cyanosis or clubbing.  GI:   obese, soft & nt; nml bowel sounds; no organomegaly or masses detected.   Musco: Warm bil, no deformities or joint swelling noted.   Neuro: alert, no focal deficits noted.    Skin: Warm, no lesions or rashes    Lab Results:  CBC    Component Value Date/Time   WBC 7.6 05/30/2024 1147   RBC 4.42 05/30/2024 1147   HGB 13.3 05/30/2024 1147   HCT 40.9 05/30/2024 1147   PLT 338 05/30/2024 1147   MCV 93 05/30/2024 1147   MCH 30.1 05/30/2024 1147   MCHC 32.5 05/30/2024 1147   RDW 12.7 05/30/2024 1147   LYMPHSABS 3.5 (H) 05/02/2024 1137   EOSABS 0.5 (H) 05/02/2024 1137   BASOSABS 0.1 05/02/2024 1137    BMET    Component Value Date/Time   NA 142 05/02/2024 1137   K 4.6 05/02/2024 1137   CL 104 05/02/2024 1137   CO2 24 05/02/2024 1137   GLUCOSE 67 (L) 05/02/2024 1137   BUN 15 05/02/2024 1137   CREATININE 0.89 05/02/2024 1137   CALCIUM  9.6 05/02/2024 1137     BNP No results found for: BNP  ProBNP No results found for: PROBNP  Imaging: CARDIAC CATHETERIZATION Result Date: 05/09/2024 1.  Patent left main with no stenosis 2.  Patent LAD with mild diffuse plaquing but no significant stenosis 3.  Patent circumflex with moderate ostial stenosis of an OM branch and severe mid vessel stenosis of that same branch.  This is a small caliber vessel not suitable for PCI 4.  Patent, dominant RCA with no stenosis and mild diffuse plaquing 5.  Mildly elevated LVEDP Recommendations: Medical therapy.  No target for PCI.  Major branches are all patent with only mild diffuse irregularity.    Administration History     None           No data to display          No results found for: NITRICOXIDE   Assessment & Plan:   Assessment & Plan OSA (obstructive sleep apnea) -Treated with CPAP for 23+ years; unknown severity of OSA and machine is 58 years old so there is no SD card or way to get a compliance download.  Patient does not know her settings but indicates that she has changed them manually over the years. -  Patient continues to benefit from usage of the machine; continue to use current machine until a new order can be placed following sleep study completion -  Plan for split night study to evaluate degree of OSA and optimal CPAP settings Tobacco abuse -  Discussed smoking cessation; patient agreeable to quitting on her own -  Chest CT completed September 2025 negative for nodules but will need yearly screening DOE (dyspnea on  exertion) -  Mild emphysema seen on CT -  Plan for PFTs to fully evaluate lung function -  Patient not using Symbicort or Albuterol at this point   Return in about 3 months (around 09/05/2024) for sleep study review, PFT.  Candis Dandy, PA-C 06/05/2024

## 2024-06-05 NOTE — Assessment & Plan Note (Signed)
-  Treated with CPAP for 23+ years; unknown severity of OSA and machine is 58 years old so there is no SD card or way to get a compliance download.  Patient does not know her settings but indicates that she has changed them manually over the years. -  Patient continues to benefit from usage of the machine; continue to use current machine until a new order can be placed following sleep study completion -  Plan for split night study to evaluate degree of OSA and optimal CPAP settings

## 2024-06-16 ENCOUNTER — Ambulatory Visit (HOSPITAL_BASED_OUTPATIENT_CLINIC_OR_DEPARTMENT_OTHER): Admitting: Internal Medicine

## 2024-06-16 DIAGNOSIS — G478 Other sleep disorders: Secondary | ICD-10-CM | POA: Insufficient documentation

## 2024-06-16 DIAGNOSIS — G4733 Obstructive sleep apnea (adult) (pediatric): Secondary | ICD-10-CM | POA: Diagnosis present

## 2024-06-16 DIAGNOSIS — R059 Cough, unspecified: Secondary | ICD-10-CM | POA: Insufficient documentation

## 2024-06-16 DIAGNOSIS — R0683 Snoring: Secondary | ICD-10-CM | POA: Insufficient documentation

## 2024-06-25 NOTE — Procedures (Signed)
 [facility] [address] [place], [place] [address] Tel: [phone]   Fax: [phone]  Polysomnography Interpretation  Patient Name:  Natasha Sanchez, Natasha Sanchez Study Date:  06/16/2024 Referring Physician:  JOAN GILLIS [phone]) %%startinterp%% Indications for Polysomnography The patient is a 58 year-old Female who is 5' 3 and weighs 289.0 lbs. Her BMI equals 51.2.  A full night polysomnogram was performed to evaluate for -.OSA  Medication  Rosuvastatin   Gabapentin  Pantoprazole  Ropinirole  Vitamin B Complex  Vitamin D  Stool softener  Xanax  fluoxetine   Polysomnogram Data A full night polysomnogram recorded the standard physiologic parameters including EEG, EOG, EMG, EKG, nasal and oral airflow.  Respiratory parameters of chest and abdominal movements were recorded with Respiratory Inductance Plethysmography belts.  Oxygen saturation was recorded by pulse oximetry.   Sleep Architecture The total recording time of the polysomnogram was 466.4 minutes.  The total sleep time was 44.0 minutes.  The patient spent 13.6% of total sleep time in Stage N1, 86.4% in Stage N2, 0.0% in Stages N3, and 0.0% in REM.  Sleep latency was 170.4 minutes.  REM latency was - minutes.  Sleep Efficiency was 9.4%.  Wake after Sleep Onset time was 252.0 minutes.  Respiratory Events The polysomnogram revealed a presence of - obstructive, - central, and - mixed apneas resulting in an Apnea index of - events per hour.  There were 23 hypopneas (>=3% desaturation and/or arousal) resulting in an Apnea\Hypopnea Index (AHI >=3% desaturation and/or arousal) of 31.4 events per hour.  There were 8 hypopneas (>=4% desaturation) resulting in an Apnea\Hypopnea Index (AHI >=4% desaturation) of 10.9 events per hour.  There were - Respiratory Effort Related Arousals resulting in a RERA index of - events per hour. The Respiratory Disturbance Index is 31.4 events per hour.  The snore index was -  events per hour.  Mean oxygen saturation was 92.8%.  The lowest oxygen saturation during sleep was 87.0%.  Time spent <=88% oxygen saturation was 1.1 minutes (0.2%).  Limb Activity There were 55 total limb movements recorded, of this total, 52 were classified as PLMs.  PLM index was 70.9 per hour and PLM associated with Arousals index was 13.6 per hour.  Cardiac Summary The average pulse rate was 80.0 bpm.  The minimum pulse rate was 63.0 bpm while the maximum pulse rate was 102.0 bpm.  Cardiac rhythm was normal.  Comments: Marked difficulty initiating and maintaining sleep, with only 44 minutes of sleep time recorded- insufficient for valid interpretation. Tech commented that patient's coughing interfered with sleep. Loud snoring. Severe obstructive sleep apnea with AHI(3%) 31.4/hr. Oxygen desaturation nadir 87%, mean 92.8%. Patient wore a retainer  Diagnosis:  Suspected obstructive sleep apnea  Recommendations: Consider repeat with sleep aid, or home sleep test if appropriate.   This study was personally reviewed and electronically signed by: Reggy Salt, MD Accredited Board Certified in Sleep Medicine Date/Time: 06/25/24   11:54    %%endinterp%%   Diagnostic PSG Report  Patient Name: Natasha Sanchez, Natasha Sanchez Study Date: 06/16/2024  Date of Birth: Aug 31, 1965 Study Type: Diagnostic  Age: 72 year MRN #: [id number]  Sex: Female Interpreting Physician: SALT REGGY, [id number]  Height: 5' 3 Referring Physician: JOAN GILLIS [phone])  Weight: 289.0 lbs Recording Tech: Firman Anon RPSGT RST  BMI: 51.2 Scoring Tech: Firman Anon RPSGT RST  ESS: 4 Neck Size: 17   Study Overview  Lights Off: 09:50:03 PM  Count Index  Lights On: 05:36:30 AM Awakenings: 8 10.9  Time in Bed: 466.4 min. Arousals: 24 32.7  Total Sleep Time: 44.0 min. AHI (>=3% Desat and/or Ar.): 23 31.4   Sleep Efficiency: 9.4% AHI (>=4% Desat): 8 10.9   Sleep Latency: 170.4 min. Limb Movements: 55 75.0  Wake After Sleep  Onset: 252.0 min. Snore: - -  REM Latency from Sleep Onset: - min. Desaturations: 23 31.4     Minimum SpO2 TST: 87.0%    Sleep Architecture  % of Time in Bed Stages Time (mins) % Sleep Time  Wake 423.0   Stage N1 6.0 13.6%  Stage N2 38.0 86.4%  Stage N3 0.0 0.0%  REM 0.0 0.0%   Arousal Summary   NREM REM Sleep Index  Respiratory Arousals 5 - 5 6.8  PLM Arousals 10 - 10 13.6  Isolated Limb Movement Arousals 2 - 2 2.7  Snore Arousals - - - -  Spontaneous Arousals 9 - 9 12.3  Total 24 - 24 32.7   Limb Movement Summary   Count Index  Isolated Limb Movements 3 4.1  Periodic Limb Movements (PLMs) 52 70.9  Total Limb Movements 55 75.0    Respiratory Summary   By Sleep Stage By Body Position Total   NREM REM Supine Non-Supine   Time (min) 44.0 0.0 25.0 19.0 44.0         Obstructive Apnea - - - - -  Mixed Apnea - - - - -  Central Apnea - - - - -  Total Apneas - - - - -  Total Apnea Index - - - - -         Hypopneas (>=3% Desat and/or Ar.) 23 - 15 8 23   AHI (>=3% Desat and/or Ar.) 31.4 - 36.0 25.3 31.4         Hypopneas (>=4% Desat) 8 - 8 - 8  AHI (>=4% Desat) 10.9 - 19.2 - 10.9          RERAs - - - - -  RERA Index - - - - -         RDI 31.4 - 36.0 25.3 31.4    Respiratory Event Type Index  Central Apneas -  Obstructive Apneas -  Mixed Apneas -  Central Hypopneas -  Obstructive Hypopneas -  Central Apnea + Hypopnea (CAHI) -  Obstructive Apnea + Hypopnea (OAHI) 31.4   Respiratory Event Durations   Apnea Hypopnea   NREM REM NREM REM  Average (seconds) - - 22.5 -  Maximum (seconds) - - 48.1 -    Oxygen Saturation Summary   Wake NREM REM TST TIB  Average SpO2 (%) 93.0% 91.2% - 91.2% 92.8%  Minimum SpO2 (%) 84.0% 87.0% - 87.0% 84.0%  Maximum SpO2 (%) 97.0% 94.0% - 94.0% 97.0%   Oxygen Saturation Distribution  Range (%) Time in range (min) Time in range (%)  90.0 - 100.0 443.6 96.5%  80.0 - 90.0 16.3 3.5%  70.0 - 80.0 - -  60.0 - 70.0 - -  50.0 -  60.0 - -  0.0 - 50.0 - -  Time Spent <=88% SpO2  Range (%) Time in range (min) Time in range (%)  0.0 - 88.0 1.1 0.2%      Count Index  Desaturations 23 31.4    Cardiac Summary   Wake NREM REM Sleep Total  Average Pulse Rate (BPM) 80.5 74.8 - 74.8 80.0  Minimum Pulse Rate (BPM) 63.0 65.0 - 65.0 63.0  Maximum Pulse Rate (BPM) 102.0 93.0 - 93.0 102.0  Pulse Rate Distribution:  Range (bpm) Time in range (min) Time in range (%)  0.0 - 40.0 - -  40.0 - 60.0 - -  60.0 - 80.0 241.0 52.3%  80.0 - 100.0 218.0 47.3%  100.0 - 120.0 0.3 0.1%  120.0 - 140.0 - -  140.0 - 200.0 - -      Hypnograms                      Technologist Comments  THE 38-YEAR-OLD FEMALE PATIENT PRESENTED TO THE SLEEP DISORDER CENTER FOR A SPLIT NIGHT STUDY WITH CHIEF COMPLAINTS OF OSA. THE PATIENT WAS FITTED WITH A SMALL RESMED [id number] FULL NASAL MASK FOR PRE-CPAP TRIAL, WHICH WAS TOLERATED WELL. ALL BEDTIME MEDICATIONS WERE SELF ADMINISTERED AT 2140. THE LEAD PLACEMENT WAS INITIATED, THEN THE STUDY WAS BEGUN. MODERATE TO LOUD AUDIBLE SNORING WAS NOTED THROUGHOUT THE STUDY. SUPPLEMENTAL OXYGEN WAS NOT WARRANTED DURING THE STUDY. PLMs - PLMAs WERE NOTED. ONE RESTROOM VISIT WAS MADE. NO QUESTIONABLE CARDIAC ARRHYTHMIAS WERE OBSERVED. NO OBVIOUS PARASOMNIAs WERE OBSERVED. SPLIT NIGHT CRITERIA WAS NOT MET DUE TO INSUFFICIENT AMOUNT OF SLEEP AND AHI, THEREFORE A NPSG DIAGNOSTIC STUDY WAS CONTINUED. COUGHING WAS NOTED.  THE PATIENT HAD A HARD TIME INITIATING SLEEP WHICH MADE HER RESTLESS. THE PATIENT ALSO SLEPT WITH HER RETAINER IN. THE PATIENT TOLERATED THE NPSG STUDY WELL.                              Reggy Salt Diplomate, Biomedical Engineer of Sleep Medicine  ELECTRONICALLY SIGNED ON:  06/25/2024, 11:47 AM [facility] PH: [phone]   FX: [phone] ACCREDITED BY THE AMERICAN ACADEMY OF SLEEP MEDICINE

## 2024-06-26 NOTE — Progress Notes (Signed)
Can you schedule

## 2024-07-10 ENCOUNTER — Ambulatory Visit (INDEPENDENT_AMBULATORY_CARE_PROVIDER_SITE_OTHER)

## 2024-07-10 ENCOUNTER — Encounter (HOSPITAL_BASED_OUTPATIENT_CLINIC_OR_DEPARTMENT_OTHER): Payer: Self-pay

## 2024-07-10 VITALS — BP 95/63 | HR 73 | Ht 63.0 in | Wt 284.0 lb

## 2024-07-10 DIAGNOSIS — G4733 Obstructive sleep apnea (adult) (pediatric): Secondary | ICD-10-CM

## 2024-07-10 DIAGNOSIS — Z720 Tobacco use: Secondary | ICD-10-CM

## 2024-07-10 LAB — PULMONARY FUNCTION TEST
DL/VA % pred: 130 %
DL/VA: 5.55 ml/min/mmHg/L
DLCO cor % pred: 107 %
DLCO cor: 21.12 ml/min/mmHg
DLCO unc % pred: 106 %
DLCO unc: 21.06 ml/min/mmHg
FEF 25-75 Post: 3.15 L/s
FEF 25-75 Pre: 1.81 L/s
FEF2575-%Change-Post: 74 %
FEF2575-%Pred-Post: 132 %
FEF2575-%Pred-Pre: 76 %
FEV1-%Change-Post: 21 %
FEV1-%Pred-Post: 86 %
FEV1-%Pred-Pre: 70 %
FEV1-Post: 2.16 L
FEV1-Pre: 1.78 L
FEV1FVC-%Change-Post: 2 %
FEV1FVC-%Pred-Pre: 103 %
FEV6-%Change-Post: 18 %
FEV6-%Pred-Post: 83 %
FEV6-%Pred-Pre: 70 %
FEV6-Post: 2.6 L
FEV6-Pre: 2.2 L
FEV6FVC-%Pred-Post: 103 %
FEV6FVC-%Pred-Pre: 103 %
FVC-%Change-Post: 18 %
FVC-%Pred-Post: 80 %
FVC-%Pred-Pre: 67 %
FVC-Post: 2.6 L
FVC-Pre: 2.2 L
Post FEV1/FVC ratio: 83 %
Post FEV6/FVC ratio: 100 %
Pre FEV1/FVC ratio: 81 %
Pre FEV6/FVC Ratio: 100 %
RV % pred: 114 %
RV: 2.17 L
TLC % pred: 94 %
TLC: 4.62 L

## 2024-07-10 MED ORDER — SYMBICORT 80-4.5 MCG/ACT IN AERO: 2.0000 | INHALATION_SPRAY | Freq: Two times a day (BID) | RESPIRATORY_TRACT | 5 refills | Status: AC

## 2024-07-10 NOTE — Patient Instructions (Signed)
 New CPAP ordered today; start wearing it nightly with goal of at least 4-6 hours a night.  Follow sleep hygiene as discussed.  Resume Symbicort 2 puffs twice daily.  Continue Albuterol 2 puffs inhaled every 4-6 hours as needed.    Return for follow up in 6-8 weeks.

## 2024-07-10 NOTE — Progress Notes (Signed)
 Full PFT performed today.

## 2024-07-10 NOTE — Patient Instructions (Signed)
 Full PFT performed today.

## 2024-07-10 NOTE — Progress Notes (Signed)
 @Patient  ID: Natasha Sanchez, female    DOB: July 27, 1966, 58 y.o.   MRN: [id number]  No chief complaint on file.   Referring provider: Pandora Therisa RAMAN, NP  HPI: Discussed the use of AI scribe software for clinical note transcription with the patient, who gave verbal consent to proceed.  History of Present Illness Natasha Sanchez is a 58 year old female with severe sleep apnea who presents for follow up after her sleep study.  She experienced difficulty initiating and maintaining sleep during a recent sleep study, managing only 44 minutes of sleep despite taking her normal medications and Xanax, which she has been using since her surgery for pain-related sleep disturbances. She was unable to sleep without her CPAP machine, which she has used consistently for over 20 years. The sleep study aimed to facilitate the ordering of a new CPAP machine, but the limited sleep duration hindered the study's completion.  She has significant dependency on her CPAP machine, stating that she goes into 'full on panic mode' if the power goes off and the machine stops. She describes difficulty breathing without the CPAP, especially when trying to sleep on her back, and notes that she typically sleeps on her side or stomach.  She has a history of asthma, diagnosed in her early years, and is currently using Symbicort as needed, prescribed by her nurse practitioner a few months ago due to shortness of breath. She reports not using it regularly since her activity level has decreased post-surgery. She also has albuterol for use as needed, particularly when experiencing symptoms of a chest cold or when at higher elevations.  She recently underwent a sphincterotomy and hemorrhoid removal, and reports ongoing spasms extending to the vaginal area. She visited a urologist who performed a urinalysis, revealing moderate protein and elevated bilirubin levels. She has not yet followed up on these results.    Last OV  06/05/2024: Natasha Sanchez is a 58 y/o female with PMH of CAD, DM, obesity, and OSA who presents as a new patient for evaluation of sleep.  She reports that she has had some issues with dyspnea on exertion and fatigue and has undergone outpatient workup in the form of cardiac catheterization.  Those results were reviewed and indicated that while there was coronary calcification noted, it was not amenable to PCI or stenting.  She is on medical management for coronary atherosclerosis.  She has also been struggling with hemorrhoids and bleeding from those.  Aspirin  was stopped due to the bleeding but is set to resume very soon.  This is being followed by cardiology.  She had a hemorrhoidectomy on 06/01/2024.  She states that due to the hemorrhoidectomy she has been less active in the last couple of weeks which has not exacerbated her issues with shortness of breath.  Her primary care started her on Symbicort and albuterol but she has not been using these due to lack of activity and lack of shortness of breath due to that.  She reports that she was diagnosed with sleep apnea about 23 years ago and has been using the same machine nightly since then.  She reports great benefit from use of the machine.  She orders all of her supplies online.  She attempted to complete a home sleep test 2 years ago but was unable to sleep for the test.  She reports that she goes to bed around 10 PM every night and wakes up at 6:30 in the morning.  She wakes up between 4  and 10 times per night but states that this is not due to shortness of breath or any respiratory complaints.  She reports dyspnea on exertion and chest pain as well as recent workup for this.  ROS is also positive for a nonproductive cough, irregular heartbeats, and acid reflux.  She denies fever, chills, night sweats, weight loss.  She is a former smoker having quit 10 years ago; she reports at least 30 pack years but has also resumed smoking the last 2 weeks.    TEST/EVENTS :  Split night study:  AHI 3%:  severe OSA 31.4/hr  AHI 4%: 10.9/hr   **noted that she had only 44 minutes of total sleep time**  05/09/2024 cardiac cath :  + for coronary atherosclerosis and narrowing but not amenable to stents or PCI; medical management recommended   04/27/2024 Chest CT w/ contrast:  mild emphysematous changes; severe coronary atherosclerosis; no nodules    Allergies  Allergen Reactions   Tetanus Toxoid-Containing Vaccines Swelling and Other (See Comments)    Swelling, fever. One with horse serum per pt     There is no immunization history on file for this patient.  Past Medical History:  Diagnosis Date   Anxiety    Cyst of left kidney    Depression    Duplex kidney    Left, seen on CT abdomen/pelvis 10/30/2022   GERD (gastroesophageal reflux disease)    High cholesterol    Hyperlipidemia    Hypothyroidism    Intracranial hypertension    Morbid obesity (HCC) 05/05/2018   OSA (obstructive sleep apnea)    Tachycardia    Tendonitis, Achilles, right 11/07/2015   Tightness of right heel cord 11/07/2015   Type 2 diabetes mellitus with other specified complication (HCC)    Vitamin D deficiency     Tobacco History: Social History   Tobacco Use  Smoking Status Former   Current packs/day: 1.00   Types: Cigarettes  Smokeless Tobacco Never   Counseling given: Not Answered   Outpatient Medications Prior to Visit  Medication Sig Dispense Refill   albuterol (VENTOLIN HFA) 108 (90 Base) MCG/ACT inhaler Inhale 1-2 puffs into the lungs every 4 (four) hours as needed for wheezing or shortness of breath.     ALPRAZolam (XANAX) 0.25 MG tablet Take 0.25 mg by mouth at bedtime as needed for anxiety.     FLUoxetine (PROZAC) 20 MG capsule Take 20 mg by mouth daily.     gabapentin (NEURONTIN) 100 MG capsule Take 100 mg by mouth at bedtime.     hydrocortisone (ANUSOL-HC) 2.5 % rectal cream Apply 1 Application topically.     isosorbide  mononitrate (IMDUR ) 30  MG 24 hr tablet Take 1 tablet (30 mg total) by mouth daily. (Patient taking differently: Take 15 mg by mouth at bedtime.) 90 tablet 3   levothyroxine (SYNTHROID) 112 MCG tablet Take 112 mcg by mouth daily before breakfast.     lisinopril (ZESTRIL) 5 MG tablet Take 5 mg by mouth daily.     MAGNESIUM-POTASSIUM PO Take 1 drop by mouth at bedtime.     metoprolol succinate (TOPROL-XL) 50 MG 24 hr tablet Take 50 mg by mouth daily.     MOUNJARO 2.5 MG/0.5ML Pen Inject 2.5 mg into the skin once a week.     nitroGLYCERIN  (NITROSTAT ) 0.4 MG SL tablet Place 1 tablet (0.4 mg total) under the tongue every 5 (five) minutes as needed. 25 tablet 6   pantoprazole sodium (PROTONIX) 40 mg Take 40 mg by mouth  daily.  5   phenazopyridine (PYRIDIUM) 100 MG tablet Take 100 mg by mouth 3 (three) times daily as needed.     rOPINIRole (REQUIP) 0.5 MG tablet Take 0.5 mg by mouth at bedtime.     rosuvastatin  (CRESTOR ) 20 MG tablet Take 1 tablet (20 mg total) by mouth every other day. 90 tablet 3   Semaglutide, 2 MG/DOSE, (OZEMPIC, 2 MG/DOSE,) 8 MG/3ML SOPN Inject 3 mLs into the skin once a week.     SYMBICORT 80-4.5 MCG/ACT inhaler Inhale 2 puffs into the lungs 2 (two) times daily.     No facility-administered medications prior to visit.     Review of Systems: as per hpi  Constitutional:   No  weight loss, night sweats,  Fevers, chills, fatigue, or  lassitude.  HEENT:   No headaches,  Difficulty swallowing,  Tooth/dental problems, or  Sore throat,                No sneezing, itching, ear ache, nasal congestion, post nasal drip,   CV:  No chest pain,  Orthopnea, PND, swelling in lower extremities, anasarca, dizziness, palpitations, syncope.   GI  No heartburn, indigestion, abdominal pain, nausea, vomiting, diarrhea, change in bowel habits, loss of appetite, bloody stools.   Resp: No shortness of breath with exertion or at rest.  No excess mucus, no productive cough,  No non-productive cough,  No coughing up of  blood.  No change in color of mucus.  No wheezing.  No chest wall deformity  Skin: no rash or lesions.  GU: no dysuria, change in color of urine, no urgency or frequency.  No flank pain, no hematuria   MS:  No joint pain or swelling.  No decreased range of motion.  No back pain.    Physical Exam  BP 95/63   Pulse 73   Ht 5' 3 (1.6 m)   Wt 284 lb (128.8 kg)   LMP  (LMP Unknown) Comment: Pt states ablasion 2011  SpO2 99%   BMI 50.31 kg/m   GEN: A/Ox3; pleasant , NAD, well nourished    HEENT:  [place]/AT,  EACs-clear, TMs-wnl, NOSE-clear, THROAT-clear, no lesions, no postnasal drip or exudate noted. Mallampati 4  NECK:  Supple w/ fair ROM; no JVD; normal carotid impulses w/o bruits; no thyromegaly or nodules palpated; no lymphadenopathy.    RESP  Clear  P & A; w/o, wheezes/ rales/ or rhonchi. no accessory muscle use, no dullness to percussion  CARD:  RRR, no m/r/g, no peripheral edema, pulses intact, no cyanosis or clubbing.  GI:   obese, soft & nt; nml bowel sounds; no organomegaly or masses detected.   Musco: Warm bil, no deformities or joint swelling noted.   Neuro: alert, no focal deficits noted.    Skin: Warm, no lesions or rashes    Lab Results:  CBC    Component Value Date/Time   WBC 7.6 05/30/2024 1147   RBC 4.42 05/30/2024 1147   HGB 13.3 05/30/2024 1147   HCT 40.9 05/30/2024 1147   PLT 338 05/30/2024 1147   MCV 93 05/30/2024 1147   MCH 30.1 05/30/2024 1147   MCHC 32.5 05/30/2024 1147   RDW 12.7 05/30/2024 1147   LYMPHSABS 3.5 (H) 05/02/2024 1137   EOSABS 0.5 (H) 05/02/2024 1137   BASOSABS 0.1 05/02/2024 1137    BMET    Component Value Date/Time   NA 142 05/02/2024 1137   K 4.6 05/02/2024 1137   CL 104 05/02/2024 1137   CO2  24 05/02/2024 1137   GLUCOSE 67 (L) 05/02/2024 1137   BUN 15 05/02/2024 1137   CREATININE 0.89 05/02/2024 1137   CALCIUM  9.6 05/02/2024 1137    BNP No results found for: BNP  ProBNP No results found for:  PROBNP  Imaging: Sleep Study Documents Result Date: 06/30/2024 Ordered by an unspecified provider.   Administration History     None          Latest Ref Rng & Units 07/10/2024    1:43 PM  PFT Results  FVC-Pre L 2.20  P  FVC-Predicted Pre % 67  P  FVC-Post L 2.60  P  FVC-Predicted Post % 80  P  Pre FEV1/FVC % % 81  P  Post FEV1/FCV % % 83  P  FEV1-Pre L 1.78  P  FEV1-Predicted Pre % 70  P  FEV1-Post L 2.16  P  DLCO uncorrected ml/min/mmHg 21.06  P  DLCO UNC% % 106  P  DLCO corrected ml/min/mmHg 21.12  P  DLCO COR %Predicted % 107  P  DLVA Predicted % 130  P  TLC L 4.62  P  TLC % Predicted % 94  P  RV % Predicted % 114  P    P Preliminary result    No results found for: NITRICOXIDE   Assessment & Plan:   Assessment & Plan OSA (obstructive sleep apnea)  Assessment and Plan Assessment & Plan Obstructive sleep apnea Severe obstructive sleep apnea confirmed by sleep study. CPAP use essential. Current machine outdated, new one required. Autopap settings to adjust pressure automatically. Titration study may be needed if new CPAP on Auto mode ineffective. - Ordered new CPAP machine with autopap settings. - Scheduled follow-up in 30 to 90 days to assess CPAP efficacy. - Will consider CPAP titration study if new CPAP is not effective.  Asthma Diagnosis confirmed by PFT with positive bronchodilator response. Symbicort prescribed but not used regularly. Regular use may improve exercise tolerance and reduce shortness of breath. - Refilled Symbicort prescription through [facility] home delivery. - Continue albuterol as needed for breakthrough shortness of breath. - Encouraged regular use of Symbicort to improve exercise tolerance and reduce shortness of breath.    Return in about 7 weeks (around 08/28/2024) for compliance download.  Candis Dandy, PA-C 07/10/2024

## 2024-07-12 ENCOUNTER — Encounter (HOSPITAL_BASED_OUTPATIENT_CLINIC_OR_DEPARTMENT_OTHER): Payer: Self-pay

## 2024-07-13 NOTE — Telephone Encounter (Signed)
 Can we change order to another provider

## 2024-07-13 NOTE — Telephone Encounter (Signed)
 Spoke with Mrs. Minahan to update her on order. The referral for CPAP was sent to DME company, [employer]. Order was received and confirmed by [employer] for processing the CPAP order. She is all set. Nothing further needed.

## 2024-08-15 ENCOUNTER — Telehealth (HOSPITAL_BASED_OUTPATIENT_CLINIC_OR_DEPARTMENT_OTHER): Payer: Self-pay | Admitting: *Deleted

## 2024-08-15 NOTE — Telephone Encounter (Signed)
 CMN received for CPAP supplies signed by provider and faxed confirmation received DME: [facility]

## 2024-09-05 ENCOUNTER — Encounter (HOSPITAL_BASED_OUTPATIENT_CLINIC_OR_DEPARTMENT_OTHER): Payer: Self-pay

## 2024-09-05 ENCOUNTER — Ambulatory Visit (HOSPITAL_BASED_OUTPATIENT_CLINIC_OR_DEPARTMENT_OTHER)

## 2024-09-05 VITALS — BP 112/72 | HR 79 | Ht 63.0 in | Wt 279.0 lb

## 2024-09-05 DIAGNOSIS — Z87891 Personal history of nicotine dependence: Secondary | ICD-10-CM

## 2024-09-05 DIAGNOSIS — G4733 Obstructive sleep apnea (adult) (pediatric): Secondary | ICD-10-CM | POA: Diagnosis not present

## 2024-09-05 NOTE — Progress Notes (Signed)
 "  @Patient  ID: Natasha Sanchez, female    DOB: [birth year], 59 y.o.   MRN: [id number]  Chief Complaint  Patient presents with   Sleep Apnea    Follow up     Referring provider: Pandora Therisa RAMAN, NP  HPI: Discussed the use of AI scribe software for clinical note transcription with the patient, who gave verbal consent to proceed.  History of Present Illness Natasha Sanchez is a 59 year old female with sleep apnea who presents for a follow-up on CPAP compliance and effectiveness.  She has been using her CPAP machine consistently for a little over a month, wearing it every day for at least four to six hours. She describes her usage as 'like religion.'  Average usage hours are at least 8 or more a night.  She experiences some mask leakage, particularly when turning her head while sleeping on her stomach. She previously used a pillow mask without leakage issues. Despite the leakage, it does not wake her up.  Her compliance report shows less than one event per hour, averaging 0.6 events per hour, a significant improvement from her sleep test results where she had a severe number of events despite only sleeping for about 45 minutes.  She has had extensive blood work done recently related to other medical issues and anticipates more tests based on initial findings.  She is expecting her first grandchild, a boy, in June and has recently moved her mother-in-law into a nursing home.  She reports that these issues in conjunction with medical issues are causing her some stress.  Last OV 07/10/2024: [name] is a 59 year old female with severe sleep apnea who presents for follow up after her sleep study.   She experienced difficulty initiating and maintaining sleep during a recent sleep study, managing only 44 minutes of sleep despite taking her normal medications and Xanax, which she has been using since her surgery for pain-related sleep disturbances. She was unable to sleep without her  CPAP machine, which she has used consistently for over 20 years. The sleep study aimed to facilitate the ordering of a new CPAP machine, but the limited sleep duration hindered the study's completion.   She has significant dependency on her CPAP machine, stating that she goes into 'full on panic mode' if the power goes off and the machine stops. She describes difficulty breathing without the CPAP, especially when trying to sleep on her back, and notes that she typically sleeps on her side or stomach.   She has a history of asthma, diagnosed in her early years, and is currently using Symbicort  as needed, prescribed by her nurse practitioner a few months ago due to shortness of breath. She reports not using it regularly since her activity level has decreased post-surgery. She also has albuterol for use as needed, particularly when experiencing symptoms of a chest cold or when at higher elevations.   She recently underwent a sphincterotomy and hemorrhoid removal, and reports ongoing spasms extending to the vaginal area. She visited a urologist who performed a urinalysis, revealing moderate protein and elevated bilirubin levels. She has not yet followed up on these results.  TEST/EVENTS :  Split night study:  AHI 3%:  severe OSA 31.4/hr  AHI 4%: 10.9/hr   **noted that she had only 44 minutes of total sleep time**   05/09/2024 cardiac cath :  + for coronary atherosclerosis and narrowing but not amenable to stents or PCI; medical management recommended   04/27/2024 Chest CT  w/ contrast:  mild emphysematous changes; severe coronary atherosclerosis; no nodules  Allergies[1]   There is no immunization history on file for this patient.  Past Medical History:  Diagnosis Date   Anxiety    Cyst of left kidney    Depression    Duplex kidney    Left, seen on CT abdomen/pelvis 10/30/2022   GERD (gastroesophageal reflux disease)    High cholesterol    Hyperlipidemia    Hypothyroidism    Intracranial  hypertension    Morbid obesity (HCC) 05/05/2018   OSA (obstructive sleep apnea)    Tachycardia    Tendonitis, Achilles, right 11/07/2015   Tightness of right heel cord 11/07/2015   Type 2 diabetes mellitus with other specified complication (HCC)    Vitamin D deficiency     Tobacco History: Tobacco Use History[2] Counseling given: Not Answered   Outpatient Medications Prior to Visit  Medication Sig Dispense Refill   albuterol (VENTOLIN HFA) 108 (90 Base) MCG/ACT inhaler Inhale 1-2 puffs into the lungs every 4 (four) hours as needed for wheezing or shortness of breath.     ALPRAZolam (XANAX) 0.25 MG tablet Take 0.25 mg by mouth at bedtime as needed for anxiety.     FLUoxetine (PROZAC) 20 MG capsule Take 20 mg by mouth daily.     gabapentin (NEURONTIN) 100 MG capsule Take 100 mg by mouth at bedtime.     hydrocortisone (ANUSOL-HC) 2.5 % rectal cream Apply 1 Application topically.     levothyroxine (SYNTHROID) 112 MCG tablet Take 112 mcg by mouth daily before breakfast.     lisinopril (ZESTRIL) 5 MG tablet Take 5 mg by mouth daily.     MAGNESIUM-POTASSIUM PO Take 1 drop by mouth at bedtime.     metoprolol succinate (TOPROL-XL) 50 MG 24 hr tablet Take 50 mg by mouth daily.     nitroGLYCERIN  (NITROSTAT ) 0.4 MG SL tablet Place 1 tablet (0.4 mg total) under the tongue every 5 (five) minutes as needed. 25 tablet 6   pantoprazole sodium (PROTONIX) 40 mg Take 40 mg by mouth daily.  5   phenazopyridine (PYRIDIUM) 100 MG tablet Take 100 mg by mouth 3 (three) times daily as needed.     rOPINIRole (REQUIP) 0.5 MG tablet Take 0.5 mg by mouth at bedtime.     rosuvastatin  (CRESTOR ) 20 MG tablet Take 1 tablet (20 mg total) by mouth every other day. 90 tablet 3   Semaglutide, 2 MG/DOSE, (OZEMPIC, 2 MG/DOSE,) 8 MG/3ML SOPN Inject 3 mLs into the skin once a week.     SYMBICORT  80-4.5 MCG/ACT inhaler Inhale 2 puffs into the lungs 2 (two) times daily. 1 each 5   MOUNJARO 2.5 MG/0.5ML Pen Inject 2.5 mg into  the skin once a week.     isosorbide  mononitrate (IMDUR ) 30 MG 24 hr tablet Take 1 tablet (30 mg total) by mouth daily. (Patient taking differently: Take 15 mg by mouth at bedtime.) 90 tablet 3   No facility-administered medications prior to visit.     Review of Systems: as per hpi  Constitutional:   No  weight loss, night sweats,  Fevers, chills, fatigue, or  lassitude.  HEENT:   No headaches,  Difficulty swallowing,  Tooth/dental problems, or  Sore throat,                No sneezing, itching, ear ache, nasal congestion, post nasal drip,   CV:  No chest pain,  Orthopnea, PND, swelling in lower extremities, anasarca, dizziness, palpitations, syncope.  GI  No heartburn, indigestion, abdominal pain, nausea, vomiting, diarrhea, change in bowel habits, loss of appetite, bloody stools.   Resp: No shortness of breath with exertion or at rest.  No excess mucus, no productive cough,  No non-productive cough,  No coughing up of blood.  No change in color of mucus.  No wheezing.  No chest wall deformity  Skin: no rash or lesions.  GU: no dysuria, change in color of urine, no urgency or frequency.  No flank pain, no hematuria   MS:  No joint pain or swelling.  No decreased range of motion.  No back pain.    Physical Exam  BP 112/72   Pulse 79   Ht 5' 3 (1.6 m)   Wt 279 lb (126.6 kg)   LMP  (LMP Unknown) Comment: Pt states ablasion 2011  SpO2 98%   BMI 49.42 kg/m   GEN: A/Ox3; pleasant , NAD, well nourished    HEENT:  [place]/AT,  EACs-clear, TMs-wnl, NOSE-clear, THROAT-clear, no lesions, no postnasal drip or exudate noted. Mallampati 4  NECK:  Supple w/ fair ROM; no JVD; normal carotid impulses w/o bruits; no thyromegaly or nodules palpated; no lymphadenopathy.    RESP  Clear  P & A; w/o, wheezes/ rales/ or rhonchi. no accessory muscle use, no dullness to percussion  CARD:  RRR, no m/r/g, no peripheral edema, pulses intact, no cyanosis or clubbing.  GI:   obese soft & nt; nml bowel  sounds; no organomegaly or masses detected.   Musco: Warm bil, no deformities or joint swelling noted. Right knee with brace intact  Neuro: alert, no focal deficits noted.    Skin: Warm, no lesions or rashes    Lab Results:  CBC    Component Value Date/Time   WBC 7.6 05/30/2024 1147   RBC 4.42 05/30/2024 1147   HGB 13.3 05/30/2024 1147   HCT 40.9 05/30/2024 1147   PLT 338 05/30/2024 1147   MCV 93 05/30/2024 1147   MCH 30.1 05/30/2024 1147   MCHC 32.5 05/30/2024 1147   RDW 12.7 05/30/2024 1147   LYMPHSABS 3.5 (H) 05/02/2024 1137   EOSABS 0.5 (H) 05/02/2024 1137   BASOSABS 0.1 05/02/2024 1137    BMET    Component Value Date/Time   NA 142 05/02/2024 1137   K 4.6 05/02/2024 1137   CL 104 05/02/2024 1137   CO2 24 05/02/2024 1137   GLUCOSE 67 (L) 05/02/2024 1137   BUN 15 05/02/2024 1137   CREATININE 0.89 05/02/2024 1137   CALCIUM  9.6 05/02/2024 1137    BNP No results found for: BNP  ProBNP No results found for: PROBNP  Imaging: No results found.  Administration History     None          Latest Ref Rng & Units 07/10/2024    1:43 PM  PFT Results  FVC-Pre L 2.20   FVC-Predicted Pre % 67   FVC-Post L 2.60   FVC-Predicted Post % 80   Pre FEV1/FVC % % 81   Post FEV1/FCV % % 83   FEV1-Pre L 1.78   FEV1-Predicted Pre % 70   FEV1-Post L 2.16   DLCO uncorrected ml/min/mmHg 21.06   DLCO UNC% % 106   DLCO corrected ml/min/mmHg 21.12   DLCO COR %Predicted % 107   DLVA Predicted % 130   TLC L 4.62   TLC % Predicted % 94   RV % Predicted % 114     No results found for: NITRICOXIDE  Assessment & Plan:   Assessment & Plan OSA (obstructive sleep apnea)  Assessment and Plan Assessment & Plan Severe obstructive sleep apnea Well-managed with CPAP. Compliance excellent. Apnea events reduced to 0.6/hour. - Continue CPAP therapy. - Consider nasal pillow mask if leakage worsens. - Contact CPAP company for mask adjustments if needed. - Follow-up  in one year unless issues arise.    Return in about 1 year (around 09/05/2025) for COMPLIANCE DOWNLOAD.  Candis Dandy, PA-C 09/05/2024      [1]  Allergies Allergen Reactions   Tetanus Toxoid-Containing Vaccines Swelling and Other (See Comments)    Swelling, fever. One with horse serum per pt  [2]  Social History Tobacco Use  Smoking Status Former   Current packs/day: 1.00   Types: Cigarettes  Smokeless Tobacco Never   "

## 2024-09-28 NOTE — Telephone Encounter (Signed)
 Encounter opened in error.
